# Patient Record
Sex: Female | Born: 1985 | Race: Black or African American | Hispanic: No | Marital: Single | Smoking: Current some day smoker
Health system: Southern US, Community
[De-identification: ages and names within clinical notes are randomized; demographics above are authoritative.]

## PROBLEM LIST (undated history)

## (undated) DIAGNOSIS — K259 Gastric ulcer, unspecified as acute or chronic, without hemorrhage or perforation: Secondary | ICD-10-CM

## (undated) DIAGNOSIS — K589 Irritable bowel syndrome without diarrhea: Secondary | ICD-10-CM

## (undated) DIAGNOSIS — J45909 Unspecified asthma, uncomplicated: Secondary | ICD-10-CM

## (undated) HISTORY — PX: ABDOMINAL HYSTERECTOMY: SHX81

---

## 2017-03-22 ENCOUNTER — Ambulatory Visit (HOSPITAL_COMMUNITY)
Admission: EM | Admit: 2017-03-22 | Discharge: 2017-03-22 | Disposition: A | Discharge status: Home / Self Care | Payer: PRIVATE HEALTH INSURANCE | Attending: Internal Medicine | Admitting: Internal Medicine

## 2017-03-22 ENCOUNTER — Encounter (HOSPITAL_COMMUNITY): Payer: Self-pay | Admitting: Emergency Medicine

## 2017-03-22 DIAGNOSIS — J45909 Unspecified asthma, uncomplicated: Secondary | ICD-10-CM | POA: Insufficient documentation

## 2017-03-22 DIAGNOSIS — Z880 Allergy status to penicillin: Secondary | ICD-10-CM | POA: Insufficient documentation

## 2017-03-22 DIAGNOSIS — J029 Acute pharyngitis, unspecified: Secondary | ICD-10-CM | POA: Diagnosis present

## 2017-03-22 DIAGNOSIS — J039 Acute tonsillitis, unspecified: Secondary | ICD-10-CM | POA: Insufficient documentation

## 2017-03-22 DIAGNOSIS — K589 Irritable bowel syndrome without diarrhea: Secondary | ICD-10-CM | POA: Diagnosis not present

## 2017-03-22 DIAGNOSIS — E860 Dehydration: Secondary | ICD-10-CM | POA: Insufficient documentation

## 2017-03-22 DIAGNOSIS — Z886 Allergy status to analgesic agent status: Secondary | ICD-10-CM | POA: Diagnosis not present

## 2017-03-22 DIAGNOSIS — F17210 Nicotine dependence, cigarettes, uncomplicated: Secondary | ICD-10-CM | POA: Diagnosis not present

## 2017-03-22 HISTORY — DX: Gastric ulcer, unspecified as acute or chronic, without hemorrhage or perforation: K25.9

## 2017-03-22 HISTORY — DX: Unspecified asthma, uncomplicated: J45.909

## 2017-03-22 HISTORY — DX: Irritable bowel syndrome, unspecified: K58.9

## 2017-03-22 LAB — POCT RAPID STREP A: STREPTOCOCCUS, GROUP A SCREEN (DIRECT): NEGATIVE

## 2017-03-22 MED ORDER — KETOROLAC TROMETHAMINE 30 MG/ML IJ SOLN
INTRAMUSCULAR | Status: AC
  Filled 2017-03-22: qty 1

## 2017-03-22 MED ORDER — PREDNISONE 50 MG PO TABS: 50.0000 mg | ORAL_TABLET | Freq: Every day | ORAL | 0 refills | Status: DC

## 2017-03-22 MED ORDER — KETOROLAC TROMETHAMINE 30 MG/ML IJ SOLN: 30.0000 mg | Freq: Once | INTRAMUSCULAR | Status: DC

## 2017-03-22 MED ORDER — IBUPROFEN 800 MG PO TABS: 800.0000 mg | ORAL_TABLET | Freq: Three times a day (TID) | ORAL | 0 refills | Status: DC

## 2017-03-22 MED ORDER — SODIUM CHLORIDE 0.9 % IV BOLUS (SEPSIS)
1000.0000 mL | Freq: Once | INTRAVENOUS | Status: AC
  Administered 2017-03-22: 1000 mL via INTRAVENOUS

## 2017-03-22 MED ORDER — KETOROLAC TROMETHAMINE 30 MG/ML IJ SOLN
30.0000 mg | Freq: Once | INTRAMUSCULAR | Status: AC
  Administered 2017-03-22: 30 mg via INTRAVENOUS

## 2017-03-22 NOTE — Discharge Instructions (Addendum)
Strep swab was negative.  A throat culture is pending.  IV fluids were given at the urgent care today for mild dehydration.  Prescriptions for ibuprofen (for discomfort) and prednisone (for throat pain) were sent to the [facility] on [address].  Push fluids and rest.  Recheck for persistent (>3-4 more days) fever >100.5 or if not starting to improve in a few days.

## 2017-03-22 NOTE — ED Notes (Signed)
Notified dr Dayton Scrape that fluids completed

## 2017-03-22 NOTE — ED Triage Notes (Signed)
PT reports her sore throat started yesterday. PT reports this AM she felt like she could taste blood in the back of her throat. PT reports now her throat pain is severe. PT reports body aches as well.

## 2017-03-23 NOTE — ED Provider Notes (Signed)
[facility]    CSN: [id number] Arrival date & time: 03/22/17  1757     History   Chief Complaint Chief Complaint  Patient presents with  . Sore Throat    HPI Sierra Ortiz is a 31 y.o. female. Presents with bad sore throat that started this am.  Achiness.  Some chills.  Not much runny/congested nose, not coughing.  No vomiting, no diarrhea.    HPI  Past Medical History:  Diagnosis Date  . Asthma   . Gastric ulcer   . IBS (irritable bowel syndrome)     Past Surgical History:  Procedure Laterality Date  . ABDOMINAL HYSTERECTOMY     partial      Home Medications    Prior to Admission medications   Medication Sig Start Date End Date Taking? Authorizing Provider  ibuprofen (ADVIL,MOTRIN) 800 MG tablet Take 1 tablet (800 mg total) by mouth 3 (three) times daily. 03/22/17   Eustace Moore, MD  predniSONE (DELTASONE) 50 MG tablet Take 1 tablet (50 mg total) by mouth daily. 03/22/17   Eustace Moore, MD    Family History No family history on file.  Social History Social History  Substance Use Topics  . Smoking status: Current Every Day Smoker    Packs/day: 0.20    Types: Cigarettes  . Smokeless tobacco: Never Used  . Alcohol use Yes     Comment: rarely     Allergies   Aspirin; Penicillins; and Vicodin [hydrocodone-acetaminophen]   Review of Systems Review of Systems  All other systems reviewed and are negative.    Physical Exam Triage Vital Signs ED Triage Vitals  Enc Vitals Group     BP 03/22/17 1834 (!) 92/51     Pulse Rate 03/22/17 1834 100     Resp 03/22/17 1834 16     Temp 03/22/17 1834 98.2 F (36.8 C)     Temp Source 03/22/17 1834 Oral     SpO2 03/22/17 1834 98 %     Weight 03/22/17 1829 105 lb (47.6 kg)     Height 03/22/17 1829  (1.549 m)     Pain Score 03/22/17 1829 8     Pain Loc --    Updated Vital Signs BP 104/67   Pulse 91   Temp 98.2 F (36.8 C) (Oral)   Resp 16   Ht  (1.549 m)   Wt 105 lb (47.6 kg)    SpO2 100%   BMI 19.84 kg/m   Physical Exam  Constitutional: She is oriented to person, place, and time. No distress.  HENT:  Head: Atraumatic.  Large red tonsils bilat, with exudate  Eyes:  Conjugate gaze observed, no eye redness/discharge  Neck: Neck supple.  Cardiovascular: Normal rate and regular rhythm.   Pulmonary/Chest: No respiratory distress. She has no wheezes. She has no rales.  Lungs clear, symmetric breath sounds   Abdominal: She exhibits no distension.  Musculoskeletal: Normal range of motion.  Neurological: She is alert and oriented to person, place, and time.  Skin: Skin is warm and dry.  Nursing note and vitals reviewed.    UC Treatments / Results  Labs Results for orders placed or performed during the hospital encounter of 03/22/17  Culture, group A strep  Result Value Ref Range   Specimen Description THROAT    Special Requests NONE    Culture TOO YOUNG TO READ    Report Status PENDING   POCT rapid strep A [facility])  Result  Value Ref Range   Streptococcus, Group A Screen (Direct) NEGATIVE NEGATIVE    Procedures Procedures (including critical care time)  Medications Ordered in UC Medications  sodium chloride 0.9 % bolus 1,000 mL (1,000 mLs Intravenous Given 03/22/17 1918)  ketorolac (TORADOL) 30 MG/ML injection 30 mg (30 mg Intravenous Given 03/22/17 1919)   Hydrated with iv saline 1L, given  iv ketorolac, with improvement in symptoms.      Final Clinical Impressions(s) / UC Diagnoses   Final diagnoses:  Acute tonsillitis, unspecified etiology  Mild dehydration   Strep swab was negative.  A throat culture is pending.  IV fluids were given at the urgent care today for mild dehydration.  Prescriptions for ibuprofen (for discomfort) and prednisone (for throat pain) [facility] [address].  Push fluids and rest.  Recheck for persistent (>3-4 more days) fever >100.5 or if not starting to improve in a few days.  New  Prescriptions Discharge Medication List as of 03/22/2017  8:40 PM    START taking these medications   Details  ibuprofen (ADVIL,MOTRIN) 800 MG tablet Take 1 tablet (800 mg total) by mouth 3 (three) times daily., Starting Sat 03/22/2017, Normal    predniSONE (DELTASONE) 50 MG tablet Take 1 tablet (50 mg total) by mouth daily., Starting Sat 03/22/2017, Normal         Eustace Moore, MD 03/23/17 [phone]

## 2017-03-25 LAB — CULTURE, GROUP A STREP (THRC)

## 2019-08-05 ENCOUNTER — Encounter (HOSPITAL_COMMUNITY): Payer: Self-pay | Admitting: Emergency Medicine

## 2019-08-05 ENCOUNTER — Other Ambulatory Visit: Payer: Self-pay

## 2019-08-05 ENCOUNTER — Emergency Department (HOSPITAL_COMMUNITY)
Admission: EM | Admit: 2019-08-05 | Discharge: 2019-08-05 | Disposition: A | Discharge status: Home / Self Care | Payer: 59 | Attending: Emergency Medicine | Admitting: Emergency Medicine

## 2019-08-05 DIAGNOSIS — Z79899 Other long term (current) drug therapy: Secondary | ICD-10-CM | POA: Insufficient documentation

## 2019-08-05 DIAGNOSIS — R1032 Left lower quadrant pain: Secondary | ICD-10-CM | POA: Insufficient documentation

## 2019-08-05 DIAGNOSIS — F17210 Nicotine dependence, cigarettes, uncomplicated: Secondary | ICD-10-CM | POA: Diagnosis not present

## 2019-08-05 DIAGNOSIS — J45909 Unspecified asthma, uncomplicated: Secondary | ICD-10-CM | POA: Insufficient documentation

## 2019-08-05 LAB — URINALYSIS, ROUTINE W REFLEX MICROSCOPIC
Bilirubin Urine: NEGATIVE
Glucose, UA: NEGATIVE mg/dL
Ketones, ur: 80 mg/dL — AB
Leukocytes,Ua: NEGATIVE
Nitrite: NEGATIVE
Protein, ur: NEGATIVE mg/dL
Specific Gravity, Urine: 1.023 (ref 1.005–1.030)
pH: 6 (ref 5.0–8.0)

## 2019-08-05 LAB — COMPREHENSIVE METABOLIC PANEL
ALT: 13 U/L (ref 0–44)
AST: 15 U/L (ref 15–41)
Albumin: 4.5 g/dL (ref 3.5–5.0)
Alkaline Phosphatase: 40 U/L (ref 38–126)
Anion gap: 12 (ref 5–15)
BUN: 10 mg/dL (ref 6–20)
CO2: 20 mmol/L — ABNORMAL LOW (ref 22–32)
Calcium: 9.4 mg/dL (ref 8.9–10.3)
Chloride: 104 mmol/L (ref 98–111)
Creatinine, Ser: 0.85 mg/dL (ref 0.44–1.00)
GFR calc Af Amer: >60 mL/min (ref >60)
GFR calc non Af Amer: >60 mL/min (ref >60)
Glucose, Bld: 84 mg/dL (ref 70–99)
Potassium: 3.7 mmol/L (ref 3.5–5.1)
Sodium: 136 mmol/L (ref 135–145)
Total Bilirubin: 0.9 mg/dL (ref 0.3–1.2)
Total Protein: 8.1 g/dL (ref 6.5–8.1)

## 2019-08-05 LAB — CBC
HCT: 37.6 % (ref 36.0–46.0)
Hemoglobin: 12.8 g/dL (ref 12.0–15.0)
MCH: 32.2 pg (ref 26.0–34.0)
MCHC: 34 g/dL (ref 30.0–36.0)
MCV: 94.5 fL (ref 80.0–100.0)
Platelets: 240 10*3/uL (ref 150–400)
RBC: 3.98 MIL/uL (ref 3.87–5.11)
RDW: 12.6 % (ref 11.5–15.5)
WBC: 7.9 10*3/uL (ref 4.0–10.5)
nRBC: 0 % (ref 0.0–0.2)

## 2019-08-05 LAB — LIPASE, BLOOD: Lipase: 17 U/L (ref 11–51)

## 2019-08-05 LAB — WET PREP, GENITAL
Clue Cells Wet Prep HPF POC: NONE SEEN
Sperm: NONE SEEN
Trich, Wet Prep: NONE SEEN
WBC, Wet Prep HPF POC: NONE SEEN
Yeast Wet Prep HPF POC: NONE SEEN

## 2019-08-05 LAB — I-STAT BETA HCG BLOOD, ED (MC, WL, AP ONLY): I-stat hCG, quantitative: <5 m[IU]/mL (ref <5)

## 2019-08-05 MED ORDER — IBUPROFEN 600 MG PO TABS: 600.0000 mg | ORAL_TABLET | Freq: Four times a day (QID) | ORAL | 0 refills | Status: DC | PRN

## 2019-08-05 MED ORDER — SODIUM CHLORIDE 0.9 % IV BOLUS
1000.0000 mL | Freq: Once | INTRAVENOUS | Status: AC
  Administered 2019-08-05: 1000 mL via INTRAVENOUS

## 2019-08-05 MED ORDER — KETOROLAC TROMETHAMINE 30 MG/ML IJ SOLN
30.0000 mg | Freq: Once | INTRAMUSCULAR | Status: AC
  Administered 2019-08-05: 30 mg via INTRAVENOUS
  Filled 2019-08-05: qty 1

## 2019-08-05 MED ORDER — ONDANSETRON HCL 4 MG/2ML IJ SOLN
4.0000 mg | Freq: Once | INTRAMUSCULAR | Status: AC
  Administered 2019-08-05: 4 mg via INTRAVENOUS
  Filled 2019-08-05: qty 2

## 2019-08-05 MED ORDER — SODIUM CHLORIDE 0.9% FLUSH
3.0000 mL | Freq: Once | INTRAVENOUS | Status: AC
  Administered 2019-08-05: 3 mL via INTRAVENOUS

## 2019-08-05 MED ORDER — ONDANSETRON 4 MG PO TBDP: 4.0000 mg | ORAL_TABLET | Freq: Three times a day (TID) | ORAL | 0 refills | Status: DC | PRN

## 2019-08-05 NOTE — ED Notes (Addendum)
Pt stated she has been nauseated for over a month, sharp left abdominal pain started today. Stated hx of uti, ovarian cysts, and partial hysterectomy 2004. Family at bedside.

## 2019-08-05 NOTE — ED Notes (Signed)
Patient is resting comfortably. Daughter at bedside

## 2019-08-05 NOTE — ED Notes (Signed)
ED Provider at bedside. 

## 2019-08-05 NOTE — ED Notes (Signed)
Requested supplies at bedside 

## 2019-08-05 NOTE — ED Provider Notes (Signed)
[place] DEPT Provider Note   CSN: [id number] Arrival date & time: 08/05/19  1906     History   Chief Complaint Chief Complaint  Patient presents with  . Abdominal Pain  . Back Pain    HPI Sierra Ortiz is a 33 y.o. female.     Pt presents to the ED today with LLQ abdominal pain.  Pt said she's had the pain for about 1 month.  She thought she may have a UTI, so her pcp called her in [place].  She said that helped a little, but the pain came back.  She also has a hx of ovarian cysts and this feels similar.  The pt has a hx of a partial hyst, but still has her ovaries.  No fevers, but has had chills.     Past Medical History:  Diagnosis Date  . Asthma   . Gastric ulcer   . IBS (irritable bowel syndrome)     There are no active problems to display for this patient.   Past Surgical History:  Procedure Laterality Date  . ABDOMINAL HYSTERECTOMY     partial      OB History   No obstetric history on file.      Home Medications    Prior to Admission medications   Medication Sig Start Date End Date Taking? Authorizing Provider  acetaminophen (TYLENOL) 500 MG tablet Take 1,000 mg by mouth every 6 (six) hours as needed for mild pain.   Yes [provider]  MELATONIN PO Take 1 tablet by mouth at bedtime as needed (sleep).   Yes [provider]  ibuprofen (ADVIL) 600 MG tablet Take 1 tablet (600 mg total) by mouth every 6 (six) hours as needed. 08/05/19   Isla Pence, MD  ondansetron (ZOFRAN ODT) 4 MG disintegrating tablet Take 1 tablet (4 mg total) by mouth every 8 (eight) hours as needed. 08/05/19   Isla Pence, MD  predniSONE (DELTASONE) 50 MG tablet Take 1 tablet (50 mg total) by mouth daily. Patient not taking: Reported on 08/05/2019 03/22/17   Wynona Luna, MD    Family History History reviewed. No pertinent family history.  Social History Social History   Tobacco Use  . Smoking status: Current  Every Day Smoker    Packs/day: 0.20    Types: Cigarettes  . Smokeless tobacco: Never Used  Substance Use Topics  . Alcohol use: Yes    Comment: rarely  . Drug use: No     Allergies   Aspirin, Hydrocodone-acetaminophen, Penicillin v, Penicillins, and Vicodin [hydrocodone-acetaminophen]   Review of Systems Review of Systems  Gastrointestinal: Positive for abdominal pain, nausea and vomiting.  All other systems reviewed and are negative.    Physical Exam Updated Vital Signs BP 114/82   Pulse (!) 102   Temp 98.2 F (36.8 C) (Oral)   Resp 17   Ht 5\' 2"  (1.575 m)   Wt 47.6 kg   SpO2 100%   BMI 19.20 kg/m   Physical Exam Vitals signs and nursing note reviewed. Exam conducted with a chaperone present.  Constitutional:      Appearance: She is well-developed.  HENT:     Head: Normocephalic and atraumatic.     Mouth/Throat:     Mouth: Mucous membranes are moist.     Pharynx: Oropharynx is clear.  Eyes:     Extraocular Movements: Extraocular movements intact.     Pupils: Pupils are equal, round, and reactive to light.  Cardiovascular:  Rate and Rhythm: Regular rhythm. Tachycardia present.  Pulmonary:     Effort: Pulmonary effort is normal.     Breath sounds: Normal breath sounds.  Abdominal:     General: Abdomen is flat.     Tenderness: There is abdominal tenderness in the left lower quadrant.  Genitourinary:    Vagina: Normal.     Cervix: Normal.     Uterus: Normal.      Adnexa: Right adnexa normal and left adnexa normal.  Skin:    General: Skin is warm.     Capillary Refill: Capillary refill takes less than 2 seconds.  Neurological:     General: No focal deficit present.     Mental Status: She is alert and oriented to person, place, and time.  Psychiatric:        Mood and Affect: Mood normal.        Behavior: Behavior normal.      ED Treatments / Results  Labs (all labs ordered are listed, but only abnormal results are displayed) Labs Reviewed   COMPREHENSIVE METABOLIC PANEL - Abnormal; Notable for the following components:      Result Value   CO2 20 (*)    All other components within normal limits  URINALYSIS, ROUTINE W REFLEX MICROSCOPIC - Abnormal; Notable for the following components:   Hgb urine dipstick SMALL (*)    Ketones, ur 80 (*)    Bacteria, UA RARE (*)    All other components within normal limits  WET PREP, GENITAL  LIPASE, BLOOD  CBC  I-STAT BETA HCG BLOOD, ED ([facility], [facility], [facility] ONLY)  GC/CHLAMYDIA PROBE AMP ([place] NOT [facility]    EKG None  Radiology No results found.  Procedures Procedures (including critical care time)  Medications Ordered in ED Medications  sodium chloride flush (NS) 0.9 % injection 3 mL (3 mLs Intravenous Given by Other 08/05/19 1952)  sodium chloride 0.9 % bolus 1,000 mL (0 mLs Intravenous Stopped 08/05/19 2138)  ondansetron (ZOFRAN) injection 4 mg (4 mg Intravenous Given 08/05/19 2017)  ketorolac (TORADOL) 30 MG/ML injection 30 mg (30 mg Intravenous Given 08/05/19 2017)     Initial Impression / Assessment and Plan / ED Course  I have reviewed the triage vital signs and the nursing notes.  Pertinent labs & imaging results that were available during my care of the patient were reviewed by me and considered in my medical decision making (see chart for details).       Pt said this feels like an ovarian cyst.  Labs wnl.  Pain is much better.  Pt is stable for d/c.  She knows to return if worse.  F[facility] with [facility].  Final Clinical Impressions(s) / ED Diagnoses   Final diagnoses:  Left lower quadrant abdominal pain    ED Discharge Orders         Ordered    ibuprofen (ADVIL) 600 MG tablet  Every 6 hours PRN     08/05/19 2213    ondansetron (ZOFRAN ODT) 4 MG disintegrating tablet  Every 8 hours PRN     08/05/19 2213           Jacalyn LefevreHaviland, Derrill Bagnell, MD 08/05/19 2216

## 2019-08-05 NOTE — ED Triage Notes (Signed)
Patient complianing of left lower abdominal pain that has been going on for a month. Patient states that she went to her doctor and he treated her for a uti. Patient states that the pain went away but is back.

## 2019-08-07 LAB — GC/CHLAMYDIA PROBE AMP (~~LOC~~) NOT AT ARMC
Chlamydia: NEGATIVE
Neisseria Gonorrhea: NEGATIVE

## 2020-09-07 ENCOUNTER — Emergency Department (HOSPITAL_COMMUNITY): Payer: 59

## 2020-09-07 ENCOUNTER — Emergency Department (HOSPITAL_COMMUNITY)
Admission: EM | Admit: 2020-09-07 | Discharge: 2020-09-07 | Disposition: A | Discharge status: Home / Self Care | Payer: 59 | Attending: Emergency Medicine | Admitting: Emergency Medicine

## 2020-09-07 ENCOUNTER — Encounter (HOSPITAL_COMMUNITY): Payer: Self-pay

## 2020-09-07 DIAGNOSIS — R102 Pelvic and perineal pain: Secondary | ICD-10-CM | POA: Diagnosis not present

## 2020-09-07 DIAGNOSIS — R1032 Left lower quadrant pain: Secondary | ICD-10-CM | POA: Insufficient documentation

## 2020-09-07 DIAGNOSIS — N3001 Acute cystitis with hematuria: Secondary | ICD-10-CM

## 2020-09-07 DIAGNOSIS — J45909 Unspecified asthma, uncomplicated: Secondary | ICD-10-CM | POA: Insufficient documentation

## 2020-09-07 DIAGNOSIS — R112 Nausea with vomiting, unspecified: Secondary | ICD-10-CM | POA: Insufficient documentation

## 2020-09-07 DIAGNOSIS — F17210 Nicotine dependence, cigarettes, uncomplicated: Secondary | ICD-10-CM | POA: Diagnosis not present

## 2020-09-07 DIAGNOSIS — N200 Calculus of kidney: Secondary | ICD-10-CM

## 2020-09-07 LAB — CBC
HCT: 35.9 % — ABNORMAL LOW (ref 36.0–46.0)
Hemoglobin: 12.7 g/dL (ref 12.0–15.0)
MCH: 32.6 pg (ref 26.0–34.0)
MCHC: 35.4 g/dL (ref 30.0–36.0)
MCV: 92.3 fL (ref 80.0–100.0)
Platelets: 216 10*3/uL (ref 150–400)
RBC: 3.89 MIL/uL (ref 3.87–5.11)
RDW: 13.2 % (ref 11.5–15.5)
WBC: 5.7 10*3/uL (ref 4.0–10.5)
nRBC: 0 % (ref 0.0–0.2)

## 2020-09-07 LAB — URINALYSIS, ROUTINE W REFLEX MICROSCOPIC
Bilirubin Urine: NEGATIVE
Glucose, UA: NEGATIVE mg/dL
Ketones, ur: 80 mg/dL — AB
Leukocytes,Ua: NEGATIVE
Nitrite: POSITIVE — AB
Protein, ur: 100 mg/dL — AB
RBC / HPF: >50 RBC/hpf — ABNORMAL HIGH (ref 0–5)
Specific Gravity, Urine: 1.026 (ref 1.005–1.030)
pH: 9 — ABNORMAL HIGH (ref 5.0–8.0)

## 2020-09-07 LAB — COMPREHENSIVE METABOLIC PANEL
ALT: 12 U/L (ref 0–44)
AST: 15 U/L (ref 15–41)
Albumin: 4.5 g/dL (ref 3.5–5.0)
Alkaline Phosphatase: 35 U/L — ABNORMAL LOW (ref 38–126)
Anion gap: 10 (ref 5–15)
BUN: 10 mg/dL (ref 6–20)
CO2: 23 mmol/L (ref 22–32)
Calcium: 9.2 mg/dL (ref 8.9–10.3)
Chloride: 106 mmol/L (ref 98–111)
Creatinine, Ser: 0.83 mg/dL (ref 0.44–1.00)
GFR calc Af Amer: >60 mL/min (ref >60)
GFR calc non Af Amer: >60 mL/min (ref >60)
Glucose, Bld: 101 mg/dL — ABNORMAL HIGH (ref 70–99)
Potassium: 3.6 mmol/L (ref 3.5–5.1)
Sodium: 139 mmol/L (ref 135–145)
Total Bilirubin: 0.8 mg/dL (ref 0.3–1.2)
Total Protein: 7.8 g/dL (ref 6.5–8.1)

## 2020-09-07 LAB — LIPASE, BLOOD: Lipase: 20 U/L (ref 11–51)

## 2020-09-07 LAB — I-STAT BETA HCG BLOOD, ED (MC, WL, AP ONLY): I-stat hCG, quantitative: <5 m[IU]/mL (ref <5)

## 2020-09-07 MED ORDER — HYDROCODONE-ACETAMINOPHEN 5-325 MG PO TABS: 1.0000 | ORAL_TABLET | ORAL | 0 refills | Status: DC | PRN

## 2020-09-07 MED ORDER — HYDROMORPHONE HCL 1 MG/ML IJ SOLN
0.5000 mg | Freq: Once | INTRAMUSCULAR | Status: AC
  Administered 2020-09-07: 0.5 mg via INTRAVENOUS
  Filled 2020-09-07: qty 1

## 2020-09-07 MED ORDER — SULFAMETHOXAZOLE-TRIMETHOPRIM 800-160 MG PO TABS: 1.0000 | ORAL_TABLET | Freq: Two times a day (BID) | ORAL | 0 refills | Status: AC

## 2020-09-07 MED ORDER — KETOROLAC TROMETHAMINE 30 MG/ML IJ SOLN
30.0000 mg | Freq: Once | INTRAMUSCULAR | Status: AC
  Administered 2020-09-07: 30 mg via INTRAVENOUS
  Filled 2020-09-07: qty 1

## 2020-09-07 MED ORDER — ONDANSETRON 4 MG PO TBDP: 4.0000 mg | ORAL_TABLET | Freq: Three times a day (TID) | ORAL | 0 refills | Status: DC | PRN

## 2020-09-07 MED ORDER — HYDROMORPHONE HCL 1 MG/ML IJ SOLN
1.0000 mg | Freq: Once | INTRAMUSCULAR | Status: AC
  Administered 2020-09-07: 1 mg via INTRAVENOUS
  Filled 2020-09-07: qty 1

## 2020-09-07 MED ORDER — SODIUM CHLORIDE 0.9 % IV SOLN
1.0000 g | Freq: Once | INTRAVENOUS | Status: AC
  Administered 2020-09-07: 1 g via INTRAVENOUS
  Filled 2020-09-07: qty 10

## 2020-09-07 MED ORDER — ONDANSETRON HCL 4 MG/2ML IJ SOLN
4.0000 mg | Freq: Once | INTRAMUSCULAR | Status: AC
  Administered 2020-09-07: 4 mg via INTRAVENOUS
  Filled 2020-09-07: qty 2

## 2020-09-07 NOTE — ED Provider Notes (Signed)
[facility] Provider Note   CSN: [id number] Arrival date & time: 09/07/20  1123     History Chief Complaint  Patient presents with  . Abdominal Pain    Sierra Ortiz is a 34 y.o. female w/ past medical history of gastric ulcer, IBS, ovarian cyst, partial hysterectomy that presents emergency department today for left lower quadrant abdominal pain.  Patient states that pain started around 10 AM, about 2 and half hours ago.  States that she has tried Tylenol, was in excruciating pain, sudden onset therefore called EMS.  States that she did have one episode of nonbilious nonbloody vomiting.  Is currently nauseous.  No diarrhea.  States that abdominal pain radiates down into her pelvis.  No history of kidney stones.  States that she did have previous ovarian cyst rupture, does feel slightly similar, however is worse this time.  Denies any vaginal bleeding, vaginal discharge.  Denies any chance of STDs.  No hematuria or dysuria, patient states that she was in normal health yesterday.  Was given 100 mics of fentanyl via EMS.  No recent heavy lifting or recent intercourse.  HPI     Past Medical History:  Diagnosis Date  . Asthma   . Gastric ulcer   . IBS (irritable bowel syndrome)     There are no problems to display for this patient.   Past Surgical History:  Procedure Laterality Date  . ABDOMINAL HYSTERECTOMY     partial      OB History   No obstetric history on file.     History reviewed. No pertinent family history.  Social History   Tobacco Use  . Smoking status: Current Every Day Smoker    Packs/day: 0.20    Types: Cigarettes  . Smokeless tobacco: Never Used  Substance Use Topics  . Alcohol use: Yes    Comment: rarely  . Drug use: No    Home Medications Prior to Admission medications   Medication Sig Start Date End Date Taking? Authorizing Provider  acetaminophen (TYLENOL) 500 MG tablet Take 1,000 mg by mouth every 6 (six)  hours as needed for mild pain.   Yes [provider]  QUEtiapine (SEROQUEL) 25 MG tablet Take 25 mg by mouth 3 (three) times daily as needed (anxiety).   Yes [provider]  ibuprofen (ADVIL) 600 MG tablet Take 1 tablet (600 mg total) by mouth every 6 (six) hours as needed. Patient not taking: Reported on 09/07/2020 08/05/19   Jacalyn Lefevre, MD  ondansetron (ZOFRAN ODT) 4 MG disintegrating tablet Take 1 tablet (4 mg total) by mouth every 8 (eight) hours as needed. Patient not taking: Reported on 09/07/2020 08/05/19   Jacalyn Lefevre, MD  predniSONE (DELTASONE) 50 MG tablet Take 1 tablet (50 mg total) by mouth daily. Patient not taking: Reported on 08/05/2019 03/22/17   Isa Rankin, MD  QUEtiapine (SEROQUEL) 100 MG tablet Take 100 mg by mouth at bedtime. 08/30/20   [provider]    Allergies    Aspirin, Hydrocodone-acetaminophen, Penicillin v, and Vicodin [hydrocodone-acetaminophen]  Review of Systems   Review of Systems  Constitutional: Negative for chills, diaphoresis, fatigue and fever.  HENT: Negative for congestion, sore throat and trouble swallowing.   Eyes: Negative for pain and visual disturbance.  Respiratory: Negative for cough, shortness of breath and wheezing.   Cardiovascular: Negative for chest pain, palpitations and leg swelling.  Gastrointestinal: Positive for abdominal pain, nausea and vomiting. Negative for abdominal distention and diarrhea.  Genitourinary:  Positive for flank pain and pelvic pain. Negative for decreased urine volume, difficulty urinating, dysuria, enuresis, frequency, genital sores, menstrual problem, urgency, vaginal bleeding, vaginal discharge and vaginal pain.  Musculoskeletal: Negative for back pain, neck pain and neck stiffness.  Skin: Negative for pallor.  Neurological: Negative for dizziness, speech difficulty, weakness and headaches.  Psychiatric/Behavioral: Negative for confusion.    Physical Exam Updated Vital  Signs BP 124/83   Pulse 85   Temp 98 F (36.7 C) (Oral)   Resp 18   SpO2 99%   Physical Exam Constitutional:      General: She is in acute distress.     Appearance: Normal appearance. She is ill-appearing. She is not toxic-appearing or diaphoretic.     Comments: Patient appears extremely uncomfortable, laying on the floor to find a comfortable position.  Holding abdomen and crying.   Cardiovascular:     Rate and Rhythm: Normal rate and regular rhythm.     Pulses: Normal pulses.  Pulmonary:     Effort: Pulmonary effort is normal.     Breath sounds: Normal breath sounds.  Abdominal:     General: Abdomen is flat. Bowel sounds are normal. There is no distension. There are no signs of injury.     Palpations: Abdomen is soft. There is no shifting dullness.     Tenderness: There is abdominal tenderness in the left lower quadrant. There is guarding. There is no left CVA tenderness or rebound. Negative signs include Murphy's sign, Rovsing's sign, McBurney's sign, psoas sign and obturator sign.     Comments: Tenderness from left flank up into the left lower quadrant that extends down into the left pelvis area.  Patient is guarding.  No peritoneal signs.  No ecchymosis.  Abdomen is soft.  Musculoskeletal:        General: Normal range of motion.     Comments: No tenderness tocervical, thoracic or lumbar midline spine  Skin:    General: Skin is warm and dry.     Capillary Refill: Capillary refill takes less than 2 seconds.  Neurological:     General: No focal deficit present.     Mental Status: She is alert and oriented to person, place, and time.  Psychiatric:        Mood and Affect: Mood normal.        Behavior: Behavior normal.        Thought Content: Thought content normal.     ED Results / Procedures / Treatments   Labs (all labs ordered are listed, but only abnormal results are displayed) Labs Reviewed  COMPREHENSIVE METABOLIC PANEL - Abnormal; Notable for the following  components:      Result Value   Glucose, Bld 101 (*)    Alkaline Phosphatase 35 (*)    All other components within normal limits  CBC - Abnormal; Notable for the following components:   HCT 35.9 (*)    All other components within normal limits  URINALYSIS, ROUTINE W REFLEX MICROSCOPIC - Abnormal; Notable for the following components:   APPearance HAZY (*)    pH 9.0 (*)    Hgb urine dipstick MODERATE (*)    Ketones, ur 80 (*)    Protein, ur 100 (*)    Nitrite POSITIVE (*)    RBC / HPF >50 (*)    Bacteria, UA MANY (*)    All other components within normal limits  URINE CULTURE  LIPASE, BLOOD  I-STAT BETA HCG BLOOD, ED ([facility], [facility], [facility] ONLY)  EKG None  Radiology US PELVIC COMPLETE W TRANSVAGINAL AND TORSION R/O  Result Date: 09/07/2020 CLINICAL DATA:  Left lower quadrant pain since this morning. Prior hysterectomy. EXAM: TRANSABDOMINAL AND TRANSVAGINAL ULTRASOUND OF PELVIS DOPPLER ULTRASOUND OF OVARIES TECHNIQUE: Both transabdominal and transvaginal ultrasound examinations of the pelvis were performed. Transabdominal technique was performed for global imaging of the pelvis including uterus, ovaries, adnexal regions, and pelvic cul-de-sac. It was necessary to proceed with endovaginal exam following the transabdominal exam to visualize the adnexae in better detail. Color and duplex Doppler ultrasound was utilized to evaluate blood flow to the ovaries. COMPARISON:  None. FINDINGS: Uterus Previous hysterectomy Endometrium Previous hysterectomy Right ovary Measurements: 3.1 x 1.9 x 1.7 cm = volume: 5.3 mL. Normal appearance/no adnexal mass. Left ovary Measurements: 4.0 x 2.0 x 2.4 cm = volume: 9.7 mL. Normal appearance/no adnexal mass. Pulsed Doppler evaluation of both ovaries demonstrates normal low-resistance arterial and venous waveforms. Other findings No abnormal free fluid. IMPRESSION: Normal appearing ovaries and Doppler evaluation. Remote hysterectomy No pelvic free fluid Electronically  Signed   By: Judie Petit.  Shick M.D.   On: 09/07/2020 13:37    Procedures Procedures (including critical care time)  Medications Ordered in ED Medications  HYDROmorphone (DILAUDID) injection 1 mg (1 mg Intravenous Given 09/07/20 1318)  ondansetron (ZOFRAN) injection 4 mg (4 mg Intravenous Given 09/07/20 1318)    ED Course  I have reviewed the triage vital signs and the nursing notes.  Pertinent labs & imaging results that were available during my care of the patient were reviewed by me and considered in my medical decision making (see chart for details).    MDM Rules/Calculators/A&P                          Pantera Winterrowd is a 34 y.o. female w/ past medical history of gastric ulcer, IBS, ovarian cyst, partial hysterectomy that presents emergency department today for left lower quadrant abdominal pain.  Differential to include ovarian torsion, ovarian cyst rupture, kidney stone, Pyelo, TOA.  After evaluating patient did speak to ultrasound tech to make sure patient has next to get evaluated for possible ovarian torsion.  On physical patient is extremely uncomfortable, patient has been given fentanyl and Dilaudid at this time.  Patient states that she does not want pelvic exam yet.  Pelvic ultrasound negative for ovarian torsion or ovarian cyst rupture.  Patient is still constantly in pain, will obtain CT renal stone study at this time.  Spoke to patient about pelvic exam again, patient states that she wants this after CT renal study.  CMP and CBC unremarkable, no leukocytosis or leukopenia.  Urinalysis does suggest UTI, if patient has infected stone most likely will need to have this removed.  Pt care was handed off to B. Henderely PA-C at hand off.  Complete history and physical and current plan have been communicated.  Please refer to their note for the remainder of ED care and ultimate disposition.  Awaiting CT renal study, pelvic exam.  This chart was discussed with Dr. Rodena Medin who agreed with  the care and disposition of the patient.    Final Clinical Impression(s) / ED Diagnoses Final diagnoses:  LLQ pain    Rx / DC Orders ED Discharge Orders    None       Farrel Gordon, PA-C 09/07/20 1519    Wynetta Fines, MD 09/10/20 1702

## 2020-09-07 NOTE — ED Triage Notes (Addendum)
Pt presents with c/o LLQ abdominal pain, hx of ruptured ovarian cyst. Pt has an 18 g left AC, 100 mcg fentanyl given and 4 of zofran en route.

## 2020-09-07 NOTE — ED Notes (Signed)
Pt on her knees at bedside in pain. Pt claims she "cannot get comfortable so I am getting on then floor on my knees."

## 2020-09-07 NOTE — Discharge Instructions (Addendum)
You had a 2 mm kidney stone on your left side which is likely causing your pain. Your urinalysis did show infection. You were given antibiotics here in the emergency department and I will write a prescription for outpatient antibiotics. I have also given you additional medication for pain. Take as needed. I would also recommend additional ibuprofen, do not take more than 2400 mg daily.  If you have increasing pain not controlled with your pain medication, high fever, persistent vomiting seek reevaluation the emergency department  Follow-up with urology  Return for any worsening symptoms.

## 2020-09-07 NOTE — ED Provider Notes (Signed)
Care transferred from [place], [place] at shift change. See note for full HPI  In summation 34 year old presents for LLQ and left flank pain. Began today. No hs of kidney stones. Declined GU exam from previous provider.  UA positive for UTI, culture sent CBC without leukocytosis CMP without electrolyte renal or liver abnormality Lipase 20 Preg negative [place] negative for pelvic pathology, torsion  Plan on FU on CT stone study, pain management.   Physical Exam  BP (!) 118/92   Pulse 96   Temp 98 F (36.7 C) (Oral)   Resp 18   SpO2 99%   Physical Exam Vitals and nursing note reviewed.  Constitutional:      General: She is not in acute distress.    Appearance: She is well-developed. She is not ill-appearing, toxic-appearing or diaphoretic.  HENT:     Head: Normocephalic and atraumatic.     Mouth/Throat:     Mouth: Mucous membranes are moist.  Eyes:     Pupils: Pupils are equal, round, and reactive to light.  Cardiovascular:     Rate and Rhythm: Normal rate.     Heart sounds: Normal heart sounds.  Pulmonary:     Effort: Pulmonary effort is normal. No respiratory distress.     Breath sounds: Normal breath sounds.  Abdominal:     General: Bowel sounds are normal. There is no distension.     Tenderness: There is abdominal tenderness in the left lower quadrant. There is left CVA tenderness. There is no right CVA tenderness, guarding or rebound. Negative signs include Murphy's sign and McBurney's sign.     Hernia: No hernia is present.  Genitourinary:    Comments: Declined GU exam Musculoskeletal:        General: Normal range of motion.     Cervical back: Normal range of motion.  Skin:    General: Skin is warm and dry.     Capillary Refill: Capillary refill takes less than 2 seconds.  Neurological:     General: No focal deficit present.     Mental Status: She is alert and oriented to person, place, and time.    ED Course/Procedures     Procedures Labs Reviewed   COMPREHENSIVE METABOLIC PANEL - Abnormal; Notable for the following components:      Result Value   Glucose, Bld 101 (*)    Alkaline Phosphatase 35 (*)    All other components within normal limits  CBC - Abnormal; Notable for the following components:   HCT 35.9 (*)    All other components within normal limits  URINALYSIS, ROUTINE W REFLEX MICROSCOPIC - Abnormal; Notable for the following components:   APPearance HAZY (*)    pH 9.0 (*)    Hgb urine dipstick MODERATE (*)    Ketones, ur 80 (*)    Protein, ur 100 (*)    Nitrite POSITIVE (*)    RBC / HPF >50 (*)    Bacteria, UA MANY (*)    All other components within normal limits  URINE CULTURE  LIPASE, BLOOD  I-STAT BETA HCG BLOOD, ED ([facility], [facility], [facility] ONLY)  CT Renal Stone Study  Result Date: 09/07/2020 CLINICAL DATA:  Left flank pain, kidney stone. History of gastric ulcer, IBS, ovarian cyst, partial hysterectomy. EXAM: CT ABDOMEN AND PELVIS WITHOUT CONTRAST TECHNIQUE: Multidetector CT imaging of the abdomen and pelvis was performed following the standard protocol without IV contrast. COMPARISON:  Ultrasound pelvis 09/07/2020 FINDINGS: Lower chest: No acute abnormality Hepatobiliary: No focal liver  abnormality is seen. No gallstones, gallbladder wall thickening, or biliary dilatation. Pancreas: Unremarkable. No pancreatic ductal dilatation or surrounding inflammatory changes. Spleen: Normal in size without focal abnormality. Adrenals/Urinary Tract: No adrenal nodule bilaterally. There is a 2 mm calcified stone within the inferior pole of the left kidney. No right nephrolithiasis. No hydronephrosis, and no contour-deforming renal mass. Suggestion of a 2 mm calcified left ureterovesicular junction stone. No right ureterolithiasis. No hydroureter. The urinary bladder is unremarkable. Stomach/Bowel: Stomach is within normal limits. The appendix is not definitely identified. No evidence of bowel wall thickening, distention, or inflammatory changes.  Vascular/Lymphatic: No abdominal aorta or iliac aneurysm. No abdominal, pelvic, or inguinal lymphadenopathy. Multiple phleboliths are identified within the pelvis. Reproductive: Status post hysterectomy. No adnexal masses. Bilateral ovaries appear normal in size. Other: No intraperitoneal free fluid. No intraperitoneal free gas. No organized fluid collection. Musculoskeletal: No acute or significant osseous findings. IMPRESSION: 1. Suggestion of a nonobstructive 2 mm left ureterovesicular junction stone. 2. Nonobstructive 2 mm left nephrolithiasis. Electronically Signed   By: Tish Frederickson M.D.   On: 09/07/2020 16:10   US PELVIC COMPLETE W TRANSVAGINAL AND TORSION R/O  Result Date: 09/07/2020 CLINICAL DATA:  Left lower quadrant pain since this morning. Prior hysterectomy. EXAM: TRANSABDOMINAL AND TRANSVAGINAL ULTRASOUND OF PELVIS DOPPLER ULTRASOUND OF OVARIES TECHNIQUE: Both transabdominal and transvaginal ultrasound examinations of the pelvis were performed. Transabdominal technique was performed for global imaging of the pelvis including uterus, ovaries, adnexal regions, and pelvic cul-de-sac. It was necessary to proceed with endovaginal exam following the transabdominal exam to visualize the adnexae in better detail. Color and duplex Doppler ultrasound was utilized to evaluate blood flow to the ovaries. COMPARISON:  None. FINDINGS: Uterus Previous hysterectomy Endometrium Previous hysterectomy Right ovary Measurements: 3.1 x 1.9 x 1.7 cm = volume: 5.3 mL. Normal appearance/no adnexal mass. Left ovary Measurements: 4.0 x 2.0 x 2.4 cm = volume: 9.7 mL. Normal appearance/no adnexal mass. Pulsed Doppler evaluation of both ovaries demonstrates normal low-resistance arterial and venous waveforms. Other findings No abnormal free fluid. IMPRESSION: Normal appearing ovaries and Doppler evaluation. Remote hysterectomy No pelvic free fluid Electronically Signed   By: Judie Petit.  Shick M.D.   On: 09/07/2020 13:37   MDM   Plan to follow up on CT scan and dispo.  Patient reassessed. Pain controlled. Tolerating p.o. intake without difficulty. CT scan with 2 mm UVJ stone. No hydronephrosis. Creatinine WNL. Patient is afebrile, she is without tachycardia, tachypnea or hypoxia. She has no leukocytosis. She does not appear septic or ill. She will be given Rocephin as well as Toradol for pain and UTI, Urine culture sent.  CONSULT with her [place] with urology. States patient can D/C home with antibiotics, strict return precautions and follow up outpatient.  Discussed plan with patient. She is agreeable to this. Will DC home with Abx, Rocephin given here in ED.  The patient has been appropriately medically screened and/or stabilized in the ED. I have low suspicion for any other emergent medical condition which would require further screening, evaluation or treatment in the ED or require inpatient management.  Patient is hemodynamically stable and in no acute distress.  Patient able to ambulate in department prior to ED.  Evaluation does not show acute pathology that would require ongoing or additional emergent interventions while in the emergency department or further inpatient treatment.  I have discussed the diagnosis with the patient and answered all questions.  Pain is been managed while in the emergency department and patient has no further  complaints prior to discharge.  Patient is comfortable with plan discussed in room and is stable for discharge at this time.  I have discussed strict return precautions for returning to the emergency department.  Patient was encouraged to follow-up with PCP/specialist refer to at discharge.       Kiarrah Rausch A, PA-C 09/07/20 1732    Sabino Donovan, MD 09/07/20 [phone]

## 2020-09-10 LAB — URINE CULTURE: Culture: >=100000 — AB

## 2020-09-11 ENCOUNTER — Telehealth: Payer: Self-pay | Admitting: Emergency Medicine

## 2020-09-11 NOTE — Progress Notes (Signed)
ED Antimicrobial Stewardship Positive Culture Follow Up   Sierra Ortiz is an 34 y.o. female who presented to [facility] on 09/07/2020 with a chief complaint of  Chief Complaint  Patient presents with  . Abdominal Pain    Recent Results (from the past 720 hour(s))  Urine culture     Status: Abnormal   Collection Time: 09/07/20  1:23 PM   Specimen: Urine, Clean Catch  Result Value Ref Range Status   Specimen Description   Final    URINE, CLEAN CATCH Performed at [facility], [address], [place], [place] [address]    Special Requests   Final    NONE Performed at [facility], [address], [place], [place] [address]    Culture >=100,000 COLONIES/mL ESCHERICHIA COLI (A)  Final   Report Status 09/10/2020 FINAL  Final   Organism ID, Bacteria ESCHERICHIA COLI (A)  Final      Susceptibility   Escherichia coli - MIC*    AMPICILLIN <=2 SENSITIVE Sensitive     CEFAZOLIN <=4 SENSITIVE Sensitive     CEFTRIAXONE <=0.25 SENSITIVE Sensitive     CIPROFLOXACIN <=0.25 SENSITIVE Sensitive     GENTAMICIN <=1 SENSITIVE Sensitive     IMIPENEM <=0.25 SENSITIVE Sensitive     NITROFURANTOIN <=16 SENSITIVE Sensitive     TRIMETH/SULFA >=320 RESISTANT Resistant     AMPICILLIN/SULBACTAM <=2 SENSITIVE Sensitive     PIP/TAZO <=4 SENSITIVE Sensitive     * >=100,000 COLONIES/mL ESCHERICHIA COLI    [x]  Treated with SMX/TMP, organism resistant to prescribed antimicrobial  Pt has PCN intolerance of dizziness, yeast infection. Tolerated ceftriaxone in ED.  New antibiotic prescription:  -Stop taking SMX/TMP -Prescribe cephalexin 500 mg PO q8h x7 days. No refills.   ED Provider: , PA-C   Trudee Grip, PharmD 09/11/2020, 10:36 AM Clinical Pharmacist [phone]'

## 2020-09-11 NOTE — Telephone Encounter (Signed)
Post ED Visit - Positive Culture Follow-up: Successful Patient Follow-Up  Culture assessed and recommendations reviewed by:  []  , Pharm.D. []  Enzo Bi, Pharm.D., BCPS AQ-ID []  , Pharm.D., BCPS []  Celedonio Miyamoto, Pharm.D., BCPS []  [place], Garvin Fila.D., BCPS, AAHIVP []  , Pharm.D., BCPS, AAHIVP []  Georgina Pillion, PharmD, BCPS []  , PharmD, BCPS []  [place], PharmD, BCPS []  [place], PharmD  PharmD  Positive urine culture  []  Patient discharged without antimicrobial prescription and treatment is now indicated [x]  Organism is resistant to prescribed ED discharge antimicrobial []  Patient with positive blood cultures  Changes discussed with ED provider: Estella Husk [facility] New antibiotic prescription stop taking SMX/TMP start Keflex 500mg  po tid x 7 days  Attempting to contact patient   Lysle Pearl 09/11/2020, 11:23 AM

## 2021-03-28 ENCOUNTER — Emergency Department (HOSPITAL_COMMUNITY)
Admission: EM | Admit: 2021-03-28 | Discharge: 2021-03-28 | Disposition: A | Discharge status: Home / Self Care | Payer: 59 | Attending: Emergency Medicine | Admitting: Emergency Medicine

## 2021-03-28 ENCOUNTER — Encounter (HOSPITAL_COMMUNITY): Payer: Self-pay | Admitting: *Deleted

## 2021-03-28 DIAGNOSIS — K5900 Constipation, unspecified: Secondary | ICD-10-CM | POA: Diagnosis not present

## 2021-03-28 DIAGNOSIS — R110 Nausea: Secondary | ICD-10-CM | POA: Diagnosis not present

## 2021-03-28 DIAGNOSIS — J45909 Unspecified asthma, uncomplicated: Secondary | ICD-10-CM | POA: Diagnosis not present

## 2021-03-28 DIAGNOSIS — R109 Unspecified abdominal pain: Secondary | ICD-10-CM | POA: Diagnosis present

## 2021-03-28 DIAGNOSIS — F17210 Nicotine dependence, cigarettes, uncomplicated: Secondary | ICD-10-CM | POA: Insufficient documentation

## 2021-03-28 LAB — COMPREHENSIVE METABOLIC PANEL
ALT: 13 U/L (ref 0–44)
AST: 18 U/L (ref 15–41)
Albumin: 4.7 g/dL (ref 3.5–5.0)
Alkaline Phosphatase: 37 U/L — ABNORMAL LOW (ref 38–126)
Anion gap: 8 (ref 5–15)
BUN: 7 mg/dL (ref 6–20)
CO2: 26 mmol/L (ref 22–32)
Calcium: 9.4 mg/dL (ref 8.9–10.3)
Chloride: 104 mmol/L (ref 98–111)
Creatinine, Ser: 0.75 mg/dL (ref 0.44–1.00)
GFR, Estimated: >60 mL/min (ref >60)
Glucose, Bld: 87 mg/dL (ref 70–99)
Potassium: 3.8 mmol/L (ref 3.5–5.1)
Sodium: 138 mmol/L (ref 135–145)
Total Bilirubin: 0.8 mg/dL (ref 0.3–1.2)
Total Protein: 8.2 g/dL — ABNORMAL HIGH (ref 6.5–8.1)

## 2021-03-28 LAB — CBC WITH DIFFERENTIAL/PLATELET
Abs Immature Granulocytes: 0.01 10*3/uL (ref 0.00–0.07)
Basophils Absolute: 0.1 10*3/uL (ref 0.0–0.1)
Basophils Relative: 1 %
Eosinophils Absolute: 0 10*3/uL (ref 0.0–0.5)
Eosinophils Relative: 1 %
HCT: 38.6 % (ref 36.0–46.0)
Hemoglobin: 13 g/dL (ref 12.0–15.0)
Immature Granulocytes: 0 %
Lymphocytes Relative: 51 %
Lymphs Abs: 2.8 10*3/uL (ref 0.7–4.0)
MCH: 32.9 pg (ref 26.0–34.0)
MCHC: 33.7 g/dL (ref 30.0–36.0)
MCV: 97.7 fL (ref 80.0–100.0)
Monocytes Absolute: 0.5 10*3/uL (ref 0.1–1.0)
Monocytes Relative: 10 %
Neutro Abs: 2 10*3/uL (ref 1.7–7.7)
Neutrophils Relative %: 37 %
Platelets: 212 10*3/uL (ref 150–400)
RBC: 3.95 MIL/uL (ref 3.87–5.11)
RDW: 12.9 % (ref 11.5–15.5)
WBC: 5.5 10*3/uL (ref 4.0–10.5)
nRBC: 0 % (ref 0.0–0.2)

## 2021-03-28 LAB — POC OCCULT BLOOD, ED: Fecal Occult Bld: NEGATIVE

## 2021-03-28 MED ORDER — METOCLOPRAMIDE HCL 10 MG PO TABS: 10.0000 mg | ORAL_TABLET | Freq: Four times a day (QID) | ORAL | 0 refills | Status: DC | PRN

## 2021-03-28 NOTE — ED Notes (Signed)
Discharge instructions provided to the patient and she verbalizes understanding.

## 2021-03-28 NOTE — ED Provider Notes (Signed)
[facility]-EMERGENCY DEPT Provider Note   CSN: [id number] Arrival date & time: 03/28/21  1422     History Chief Complaint  Patient presents with  . Abdominal Pain    Sierra Ortiz is a 35 y.o. female.  HPI She presents for evaluation of, cramping, decreased ability to eat, and constipation characterized by "small hard balls."  She is using OTC stool softener, Colace, 2-4 times a day without relief.  She saw a provider at a walk-in clinic 3 days ago and was started on omeprazole and Zofran.  She went back today and was evaluated and sent here for evaluation of multiple problems including "small bowel obstruction, Zollinger-Ellison syndrome, peptic ulcer disease, gastric outlet obstruction."  Patient has a history of gastric ulcer treated several years ago successfully without ongoing care needed.  She has intermittent bouts of constipation.  She has nausea but no constipation.  There are no other known modifying factors.    Past Medical History:  Diagnosis Date  . Asthma   . Gastric ulcer   . IBS (irritable bowel syndrome)     There are no problems to display for this patient.   Past Surgical History:  Procedure Laterality Date  . ABDOMINAL HYSTERECTOMY     partial      OB History   No obstetric history on file.     No family history on file.  Social History   Tobacco Use  . Smoking status: Current Every Day Smoker    Packs/day: 0.20    Types: Cigarettes  . Smokeless tobacco: Never Used  Substance Use Topics  . Alcohol use: Yes    Comment: rarely  . Drug use: No    Home Medications Prior to Admission medications   Medication Sig Start Date End Date Taking? Authorizing Provider  metoCLOPramide (REGLAN) 10 MG tablet Take 1 tablet (10 mg total) by mouth every 6 (six) hours as needed for nausea (nausea, and to promote gastrointestinal mobility.). 03/28/21  Yes Mancel Bale, MD  acetaminophen (TYLENOL) 500 MG tablet Take 1,000 mg by mouth  every 6 (six) hours as needed for mild pain.    [provider]  HYDROcodone-acetaminophen (NORCO/VICODIN) 5-325 MG tablet Take 1 tablet by mouth every 4 (four) hours as needed. 09/07/20   Henderly, Britni A, PA-C  ibuprofen (ADVIL) 600 MG tablet Take 1 tablet (600 mg total) by mouth every 6 (six) hours as needed. Patient not taking: Reported on 09/07/2020 08/05/19   Jacalyn Lefevre, MD  ondansetron (ZOFRAN ODT) 4 MG disintegrating tablet Take 1 tablet (4 mg total) by mouth every 8 (eight) hours as needed for nausea or vomiting. 09/07/20   Henderly, Britni A, PA-C  predniSONE (DELTASONE) 50 MG tablet Take 1 tablet (50 mg total) by mouth daily. Patient not taking: Reported on 08/05/2019 03/22/17   Isa Rankin, MD  QUEtiapine (SEROQUEL) 100 MG tablet Take 100 mg by mouth at bedtime. 08/30/20   [provider]  QUEtiapine (SEROQUEL) 25 MG tablet Take 25 mg by mouth 3 (three) times daily as needed (anxiety).    [provider]    Allergies    Aspirin, Hydrocodone-acetaminophen, Penicillin v, and Vicodin [hydrocodone-acetaminophen]  Review of Systems   Review of Systems  All other systems reviewed and are negative.   Physical Exam Updated Vital Signs BP 113/78   Pulse 81   Temp 98.5 F (36.9 C) (Oral)   Resp 16   Ht 5' 1.5" (1.562 m)   Wt 51.7 kg  SpO2 100%   BMI 21.19 kg/m   Physical Exam Vitals and nursing note reviewed.  Constitutional:      General: She is not in acute distress.    Appearance: She is well-developed. She is not ill-appearing.  HENT:     Head: Normocephalic and atraumatic.     Right Ear: External ear normal.     Left Ear: External ear normal.  Eyes:     Conjunctiva/sclera: Conjunctivae normal.     Pupils: Pupils are equal, round, and reactive to light.  Neck:     Trachea: Phonation normal.  Cardiovascular:     Rate and Rhythm: Normal rate and regular rhythm.     Heart sounds: Normal heart sounds.  Pulmonary:     Effort:  Pulmonary effort is normal.     Breath sounds: Normal breath sounds.  Abdominal:     General: There is no distension.     Palpations: Abdomen is soft.     Tenderness: There is abdominal tenderness (Diffuse, mild). There is no guarding.  Musculoskeletal:        General: Normal range of motion.     Cervical back: Normal range of motion and neck supple.  Skin:    General: Skin is warm and dry.  Neurological:     Mental Status: She is alert and oriented to person, place, and time.     Cranial Nerves: No cranial nerve deficit.     Sensory: No sensory deficit.     Motor: No abnormal muscle tone.     Coordination: Coordination normal.  Psychiatric:        Mood and Affect: Mood normal.        Behavior: Behavior normal.        Thought Content: Thought content normal.        Judgment: Judgment normal.     ED Results / Procedures / Treatments   Labs (all labs ordered are listed, but only abnormal results are displayed) Labs Reviewed  COMPREHENSIVE METABOLIC PANEL - Abnormal; Notable for the following components:      Result Value   Total Protein 8.2 (*)    Alkaline Phosphatase 37 (*)    All other components within normal limits  CBC WITH DIFFERENTIAL/PLATELET  POC OCCULT BLOOD, ED    EKG None  Radiology No results found.  Procedures Procedures   Medications Ordered in ED Medications - No data to display  ED Course  I have reviewed the triage vital signs and the nursing notes.  Pertinent labs & imaging results that were available during my care of the patient were reviewed by me and considered in my medical decision making (see chart for details).    MDM Rules/Calculators/A&P                           Patient Vitals for the past 24 hrs:  BP Temp Temp src Pulse Resp SpO2 Height Weight  03/28/21 1600 113/78 -- -- 81 16 100 % -- --  03/28/21 1513 122/80 -- -- 91 16 100 % -- --  03/28/21 1431 119/90 98.5 F (36.9 C) Oral (!) 104 17 100 % 5' 1.5" (1.562 m) 51.7 kg     4:18 PM Reevaluation with update and discussion. After initial assessment and treatment, an updated evaluation reveals no change in complaints.  Discussed findings with patient.  Discussed need for CAT scan and I offered her to have one although informed her that I thought it  would be negative.  I suggested that she treat constipation as the primary problem, which should improve most of her symptoms.  She was somewhat frustrated about did not want to proceed with CT imaging.  She will contact her PCP for further guidance, and referral to GI.Marland Kitchen Mancel Bale   Medical Decision Making:  This patient is presenting for evaluation of ongoing symptoms, including nausea abdominal cramping, and constipation, which does require a range of treatment options, and is a complaint that involves a moderate risk of morbidity and mortality. The differential diagnoses include constipation, internal bleeding, nonspecific GI disorder. I decided to review old records, and in summary Young female with history of gastric ulcer several years ago, not actively seeing GI, presenting with constipation not amenable to treatment with Colace.  PCP started her on omeprazole and Zofran, several days ago.  She is presenting today, after seeing her PCP, with suggestion for further testing in the ED setting.  I did not require additional historical information from anyone.  Clinical Laboratory Tests Ordered, included stool testing for occult blood, CBC and Metabolic panel. Review indicates normal findings.   Critical Interventions-clinical evaluation, laboratory testing, observation, discussion with patient  After These Interventions, the Patient was reevaluated and was found stable for discharge.  Patient with multiple symptoms, GI related, associated constipation.  Clinically stable, with reassuring laboratory findings.  She is candidate for discharge.  Patient I discussed CT imaging, and she is choosing to not have that done  today.  Patient frustrated that all of the things that her primary care provider suggested that she get done were not done today.  Patient does not have indicators for hospitalization, or further ED evaluation at this time.  CRITICAL CARE-no Performed by: Mancel Bale  Nursing Notes Reviewed/ Care Coordinated Applicable Imaging Reviewed Interpretation of Laboratory Data incorporated into ED treatment  The patient appears reasonably screened and/or stabilized for discharge and I doubt any other medical condition or other [facility] requiring further screening, evaluation, or treatment in the ED at this time prior to discharge.  Plan: Home Medications-continue current prescribed medications, and use either MiraLAX or magnesium citrate instead of Colace for constipation; Home Treatments-fiber and fluid in diet; return here if the recommended treatment, does not improve the symptoms; Recommended follow up-PCP follow-up for ongoing management.     Final Clinical Impression(s) / ED Diagnoses Final diagnoses:  Constipation, unspecified constipation type    Rx / DC Orders ED Discharge Orders         Ordered    metoCLOPramide (REGLAN) 10 MG tablet  Every 6 hours PRN        03/28/21 1620           Mancel Bale, MD 03/28/21 1624

## 2021-03-28 NOTE — ED Triage Notes (Signed)
Pt sent by PCP for concern of small bowel obstruction or gastric ulcer complications. She has had constipation for the past month, passing small hard stools.

## 2021-03-28 NOTE — Discharge Instructions (Addendum)
Your symptoms seem to be related to constipation, and not other problems such as gastric ulcer or small bowel obstruction.  We discussed various treatment options.  We can do a CAT scan today but you do not want to proceed with that.  I suspect that a CAT scan of the abdomen pelvis today would not show the cause of your discomfort.  All of the blood tests that we did, and the test for blood in the stool were negative.  Treating constipation seems to be the best process at this time.  I recommend using MiraLAX, 3 times a day until you have several daily soft bowel movements.  After that decrease to twice a day, for a week then once a day for 2 months.  If you do not want to try MiraLAX, you can use magnesium citrate, 1 bottle each day for 3 days, to help you move your bowels.  Eating a high-fiber diet can be helpful.  Drinking 2 L of water a day will help as well.  You can always return to the emergency department if your symptoms worsen or you have other concerns.  Ask your PCP about referral to a gastroenterologist for further care and treatment.  They can see you and evaluate you for the need for upper endoscopy.  I am going to give you a prescription for Reglan which can help with nausea, and improve gastrointestinal motility.  You can try taking that but be careful because it can make you sleepy.  You can use that with your other nausea medicine (Zofran/omeprazole).

## 2021-03-29 ENCOUNTER — Other Ambulatory Visit: Payer: Self-pay | Admitting: Physician Assistant

## 2021-03-29 DIAGNOSIS — R1084 Generalized abdominal pain: Secondary | ICD-10-CM

## 2021-04-11 ENCOUNTER — Inpatient Hospital Stay: Admission: RE | Admit: 2021-04-11 | Payer: 59 | Source: Ambulatory Visit

## 2021-04-12 ENCOUNTER — Ambulatory Visit
Admission: RE | Admit: 2021-04-12 | Discharge: 2021-04-12 | Disposition: A | Discharge status: Home / Self Care | Payer: 59 | Source: Ambulatory Visit | Attending: Physician Assistant | Admitting: Physician Assistant

## 2021-04-12 ENCOUNTER — Other Ambulatory Visit: Payer: 59

## 2021-04-12 DIAGNOSIS — R1084 Generalized abdominal pain: Secondary | ICD-10-CM

## 2021-04-12 MED ORDER — IOPAMIDOL (ISOVUE-370) INJECTION 76%
80.0000 mL | Freq: Once | INTRAVENOUS | Status: AC | PRN
  Administered 2021-04-12: 80 mL via INTRAVENOUS

## 2021-04-22 ENCOUNTER — Emergency Department (HOSPITAL_COMMUNITY)
Admission: EM | Admit: 2021-04-22 | Discharge: 2021-04-27 | Disposition: A | Discharge status: Home / Self Care | Payer: 59 | Attending: Emergency Medicine | Admitting: Emergency Medicine

## 2021-04-22 ENCOUNTER — Other Ambulatory Visit: Payer: Self-pay

## 2021-04-22 ENCOUNTER — Encounter (HOSPITAL_COMMUNITY): Payer: Self-pay

## 2021-04-22 DIAGNOSIS — Y909 Presence of alcohol in blood, level not specified: Secondary | ICD-10-CM | POA: Diagnosis not present

## 2021-04-22 DIAGNOSIS — F23 Brief psychotic disorder: Secondary | ICD-10-CM

## 2021-04-22 DIAGNOSIS — J45909 Unspecified asthma, uncomplicated: Secondary | ICD-10-CM | POA: Insufficient documentation

## 2021-04-22 DIAGNOSIS — F17210 Nicotine dependence, cigarettes, uncomplicated: Secondary | ICD-10-CM | POA: Diagnosis not present

## 2021-04-22 DIAGNOSIS — Z046 Encounter for general psychiatric examination, requested by authority: Secondary | ICD-10-CM | POA: Diagnosis present

## 2021-04-22 DIAGNOSIS — Z20822 Contact with and (suspected) exposure to covid-19: Secondary | ICD-10-CM | POA: Insufficient documentation

## 2021-04-22 DIAGNOSIS — F312 Bipolar disorder, current episode manic severe with psychotic features: Secondary | ICD-10-CM | POA: Insufficient documentation

## 2021-04-22 LAB — RAPID URINE DRUG SCREEN, HOSP PERFORMED
Amphetamines: NOT DETECTED
Barbiturates: NOT DETECTED
Benzodiazepines: POSITIVE — AB
Cocaine: NOT DETECTED
Opiates: NOT DETECTED
Tetrahydrocannabinol: POSITIVE — AB

## 2021-04-22 LAB — COMPREHENSIVE METABOLIC PANEL
ALT: 14 U/L (ref 0–44)
AST: 19 U/L (ref 15–41)
Albumin: 4.5 g/dL (ref 3.5–5.0)
Alkaline Phosphatase: 37 U/L — ABNORMAL LOW (ref 38–126)
Anion gap: 10 (ref 5–15)
BUN: 13 mg/dL (ref 6–20)
CO2: 22 mmol/L (ref 22–32)
Calcium: 9.4 mg/dL (ref 8.9–10.3)
Chloride: 106 mmol/L (ref 98–111)
Creatinine, Ser: 0.82 mg/dL (ref 0.44–1.00)
GFR, Estimated: >60 mL/min (ref >60)
Glucose, Bld: 99 mg/dL (ref 70–99)
Potassium: 3.6 mmol/L (ref 3.5–5.1)
Sodium: 138 mmol/L (ref 135–145)
Total Bilirubin: 0.9 mg/dL (ref 0.3–1.2)
Total Protein: 8 g/dL (ref 6.5–8.1)

## 2021-04-22 LAB — LIPID PANEL
Cholesterol: 158 mg/dL (ref 0–200)
HDL: 45 mg/dL (ref >40)
LDL Cholesterol: 99 mg/dL (ref 0–99)
Total CHOL/HDL Ratio: 3.5 RATIO
Triglycerides: 68 mg/dL (ref <150)
VLDL: 14 mg/dL (ref 0–40)

## 2021-04-22 LAB — CBC WITH DIFFERENTIAL/PLATELET
Abs Immature Granulocytes: 0.03 10*3/uL (ref 0.00–0.07)
Basophils Absolute: 0 10*3/uL (ref 0.0–0.1)
Basophils Relative: 1 %
Eosinophils Absolute: 0 10*3/uL (ref 0.0–0.5)
Eosinophils Relative: 0 %
HCT: 36.1 % (ref 36.0–46.0)
Hemoglobin: 12.1 g/dL (ref 12.0–15.0)
Immature Granulocytes: 0 %
Lymphocytes Relative: 19 %
Lymphs Abs: 1.6 10*3/uL (ref 0.7–4.0)
MCH: 32 pg (ref 26.0–34.0)
MCHC: 33.5 g/dL (ref 30.0–36.0)
MCV: 95.5 fL (ref 80.0–100.0)
Monocytes Absolute: 0.8 10*3/uL (ref 0.1–1.0)
Monocytes Relative: 10 %
Neutro Abs: 5.8 10*3/uL (ref 1.7–7.7)
Neutrophils Relative %: 70 %
Platelets: 228 10*3/uL (ref 150–400)
RBC: 3.78 MIL/uL — ABNORMAL LOW (ref 3.87–5.11)
RDW: 13.1 % (ref 11.5–15.5)
WBC: 8.3 10*3/uL (ref 4.0–10.5)
nRBC: 0 % (ref 0.0–0.2)

## 2021-04-22 LAB — ETHANOL: Alcohol, Ethyl (B): <10 mg/dL (ref <10)

## 2021-04-22 LAB — RESP PANEL BY RT-PCR (FLU A&B, COVID) ARPGX2
Influenza A by PCR: NEGATIVE
Influenza B by PCR: NEGATIVE
SARS Coronavirus 2 by RT PCR: NEGATIVE

## 2021-04-22 LAB — HCG, QUANTITATIVE, PREGNANCY: hCG, Beta Chain, Quant, S: <1 m[IU]/mL (ref <5)

## 2021-04-22 MED ORDER — LORAZEPAM 2 MG/ML IJ SOLN
2.0000 mg | Freq: Once | INTRAMUSCULAR | Status: AC
  Administered 2021-04-22: 2 mg via INTRAMUSCULAR
  Filled 2021-04-22: qty 1

## 2021-04-22 MED ORDER — DROPERIDOL 2.5 MG/ML IJ SOLN
1.2500 mg | Freq: Once | INTRAMUSCULAR | Status: AC
  Administered 2021-04-22: 1.25 mg via INTRAMUSCULAR
  Filled 2021-04-22: qty 2

## 2021-04-22 MED ORDER — ZIPRASIDONE MESYLATE 20 MG IM SOLR: 20.0000 mg | Freq: Once | INTRAMUSCULAR | Status: AC

## 2021-04-22 MED ORDER — STERILE WATER FOR INJECTION IJ SOLN
INTRAMUSCULAR | Status: AC
  Filled 2021-04-22: qty 10

## 2021-04-22 MED ORDER — STERILE WATER FOR INJECTION IJ SOLN
INTRAMUSCULAR | Status: AC
  Administered 2021-04-22: 2.1 mL
  Filled 2021-04-22: qty 10

## 2021-04-22 MED ORDER — ZIPRASIDONE MESYLATE 20 MG IM SOLR
20.0000 mg | Freq: Once | INTRAMUSCULAR | Status: AC
  Administered 2021-04-22: 20 mg via INTRAMUSCULAR
  Filled 2021-04-22: qty 20

## 2021-04-22 MED ORDER — DIPHENHYDRAMINE HCL 50 MG/ML IJ SOLN
25.0000 mg | Freq: Once | INTRAMUSCULAR | Status: AC
  Administered 2021-04-22: 25 mg via INTRAMUSCULAR
  Filled 2021-04-22: qty 1

## 2021-04-22 MED ORDER — OLANZAPINE 5 MG PO TBDP
5.0000 mg | ORAL_TABLET | Freq: Three times a day (TID) | ORAL | Status: DC | PRN
  Administered 2021-04-23: 5 mg via ORAL
  Filled 2021-04-22 (×2): qty 1

## 2021-04-22 MED ORDER — ZIPRASIDONE MESYLATE 20 MG IM SOLR
20.0000 mg | INTRAMUSCULAR | Status: AC | PRN
  Administered 2021-04-22: 20 mg via INTRAMUSCULAR
  Filled 2021-04-22: qty 20

## 2021-04-22 MED ORDER — LORAZEPAM 2 MG/ML IJ SOLN
INTRAMUSCULAR | Status: AC
  Administered 2021-04-22: 2 mg via INTRAMUSCULAR
  Filled 2021-04-22: qty 1

## 2021-04-22 MED ORDER — ZIPRASIDONE MESYLATE 20 MG IM SOLR
INTRAMUSCULAR | Status: AC
  Administered 2021-04-22: 20 mg via INTRAMUSCULAR
  Filled 2021-04-22: qty 20

## 2021-04-22 MED ORDER — LORAZEPAM 1 MG PO TABS: 1.0000 mg | ORAL_TABLET | ORAL | Status: DC | PRN

## 2021-04-22 MED ORDER — LORAZEPAM 2 MG/ML IJ SOLN: 2.0000 mg | Freq: Once | INTRAMUSCULAR | Status: AC

## 2021-04-22 NOTE — ED Notes (Signed)
Unable to ask SI/HI questions due to patients altered mental status.
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
## 2021-04-22 NOTE — ED Triage Notes (Signed)
Patient brought via [facility] for agitation. Patient was picked up from home after her mom called stating the patient has become more agitated over the last week. Hx of bipolar disorder.

## 2021-04-22 NOTE — ED Notes (Signed)
Patient is asleep.  
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
## 2021-04-22 NOTE — ED Provider Notes (Addendum)
Emergency Medicine Observation Re-evaluation Note  Sierra Ortiz is a 35 y.o. female, seen on rounds today.  Pt initially presented to the ED for complaints of Altered Mental Status Currently, the patient is held on IVC.  She is agitated threatening to sue, wants everyone to burn and hell because we are holding her here against her will.  Physical Exam  BP 136/86   Pulse 89   Temp 98.3 F (36.8 C) (Oral)   Resp 14   SpO2 100%  Physical Exam General: agitated, combative  Lungs: breathing easily, speaking in full sentences  Psych: agitated, combative  ED Course / MDM  EKG:EKG Interpretation  Date/Time:  Sunday Apr 22 2021 04:28:02 EDT Ventricular Rate:  101 PR Interval:  128 QRS Duration: 70 QT Interval:  337 QTC Calculation: 437 R Axis:   76 Text Interpretation: Sinus tachycardia Borderline repolarization abnormality No previous ECGs available Confirmed by Kennis Carina [phone]) on 04/22/2021 4:30:57 AM   I have reviewed the labs performed to date as well as medications administered while in observation.  Recent changes in the last 24 hours include recurrent agitation.  Plan  Current plan is for psych stabilization.  Additional IM geodon and ativan ordered 9:11 AM . Restraints placed.  Will attempt to remove restraints once meds are given. Patient is under full IVC at this time.      Linwood Dibbles, MD 04/22/21 [phone]

## 2021-04-22 NOTE — ED Notes (Signed)
Patient was given her lunch tray. 

## 2021-04-22 NOTE — Consult Note (Signed)
  Sierra Ortiz is a 35 year old female with history of bipolar disorder presented to [facility] via IVC by [facility] for agitation. Since admission patient remains agitated, combative, and delusional refusing care and becoming assaultive towards staff; forced medications and restraints have been used. TTS has been unable to complete assessment as a result.   Plan:  -Agitation orders initiated: EKG completed; normal -Awaiting TTS assessment

## 2021-04-22 NOTE — ED Notes (Signed)
Patients dinner tray was placed on her bedside table. Patient was asleep.

## 2021-04-22 NOTE — ED Notes (Signed)
Pt awoke and became argumentative and increasingly agitated again. Pt was medicated with earlier HELD medication.

## 2021-04-22 NOTE — ED Notes (Signed)
Pts belongings shoes, pants, shirt, are in rms 1-4 cabinets

## 2021-04-22 NOTE — ED Notes (Signed)
Pt came out of the room and started walking away. She has attempted to leave on previous shift. Patient escorted back to her room and refused to agree to stay in her room and began to try to run again.

## 2021-04-22 NOTE — ED Notes (Signed)
Pt remains unrestrained from physical restraint at this time. Will monitor post medication administration for calmness and cooperation to continue leaving Pt unrestrained. Pt explained need for compliance with staff requests to remain in room and refrain from aggressive behavior to remain so.

## 2021-04-22 NOTE — ED Notes (Signed)
[place] PD is at bedside. Patient is now lying in bed. She is changed into scrubs. Labs were drawn. Patient has not yet void for urine.

## 2021-04-22 NOTE — ED Notes (Signed)
Pt currently asleep, prior medication effective, restraints removed. Order for restraints left active if needed.

## 2021-04-22 NOTE — BH Assessment (Incomplete)
Attempted to assess patient.  Advised by attending RN that Pt is chemically restrained and asleep.

## 2021-04-22 NOTE — BH Assessment (Signed)
Clinician made contact with pt's providers in an attempt to complete pt's MH Assessment. Pt's nurse, Varney Baas, stated pt's medications have taken effect and that she cannot participate in her assessment at this time. TTS will attempt assessment at a later time; pt's nurse stated she would contact clinician when pt is awake again.

## 2021-04-22 NOTE — ED Provider Notes (Addendum)
[facility]ENCY DEPT [facility] Emergency Department Provider Note MRN:  [id number]  Arrival date & time: 04/22/21     Chief Complaint   IVC History of Present Illness   Sierra Ortiz is a 35 y.o. year-old female with a history of bipolar disorder presenting to the ED with chief complaint of IVC.  Abnormal behavior worsening over the past week.  IVC being obtained by family.  Brought in by police.  Combative, screaming.  Seems to be talking to herself, not making sense.  I was unable to obtain an accurate HPI, PMH, or ROS due to the patient's unstable psychiatric condition.  Level 5 caveat.  Review of Systems  Positive for abnormal behavior.  Patient's Health History    Past Medical History:  Diagnosis Date  . Asthma   . Gastric ulcer   . IBS (irritable bowel syndrome)     Past Surgical History:  Procedure Laterality Date  . ABDOMINAL HYSTERECTOMY     partial     History reviewed. No pertinent family history.  Social History   Socioeconomic History  . Marital status: Single    Spouse name: Not on file  . Number of children: Not on file  . Years of education: Not on file  . Highest education level: Not on file  Occupational History  . Not on file  Tobacco Use  . Smoking status: Current Every Day Smoker    Packs/day: 0.20    Types: Cigarettes  . Smokeless tobacco: Never Used  Substance and Sexual Activity  . Alcohol use: Yes    Comment: rarely  . Drug use: No  . Sexual activity: Not on file  Other Topics Concern  . Not on file  Social History Narrative  . Not on file   Social Determinants of Health   Financial Resource Strain: Not on file  Food Insecurity: Not on file  Transportation Needs: Not on file  Physical Activity: Not on file  Stress: Not on file  Social Connections: Not on file  Intimate Partner Violence: Not on file     Physical Exam   Vitals:   04/22/21 0345 04/22/21 0400  BP: 121/67 108/63  Pulse: 97 94  Resp: 20 15  Temp:     SpO2: 98% 97%    CONSTITUTIONAL: Well-appearing, screaming and flailing on the ground NEURO:  Alert and oriented x 3, no focal deficits EYES:  eyes equal and reactive ENT/NECK:  no LAD, no JVD CARDIO: Regular rate, well-perfused, normal S1 and S2 PULM:  CTAB no wheezing or rhonchi GI/GU:  normal bowel sounds, non-distended, non-tender MSK/SPINE:  No gross deformities, no edema SKIN:  no rash, atraumatic PSYCH: Erratic speech and behavior  *Additional and/or pertinent findings included in MDM below  Diagnostic and Interventional Summary    EKG Interpretation  Date/Time:  Sunday Apr 22 2021 04:28:02 EDT Ventricular Rate:  101 PR Interval:  128 QRS Duration: 70 QT Interval:  337 QTC Calculation: 437 R Axis:   76 Text Interpretation: Sinus tachycardia Borderline repolarization abnormality No previous ECGs available Confirmed by Kennis Carina [phone]) on 04/22/2021 4:30:57 AM      Labs Reviewed  COMPREHENSIVE METABOLIC PANEL - Abnormal; Notable for the following components:      Result Value   Alkaline Phosphatase 37 (*)    All other components within normal limits  CBC WITH DIFFERENTIAL/PLATELET - Abnormal; Notable for the following components:   RBC 3.78 (*)    All other components within normal limits  RESP PANEL BY RT-PCR (  FLU A&B, COVID) ARPGX2  ETHANOL  HCG, QUANTITATIVE, PREGNANCY  RAPID URINE DRUG SCREEN, HOSP PERFORMED    No orders to display    Medications  sterile water (preservative free) injection (2.1 mLs  Given 04/22/21 0252)  ziprasidone (GEODON) injection 20 mg (20 mg Intramuscular Given 04/22/21 0220)  LORazepam (ATIVAN) injection 2 mg (2 mg Intramuscular Given 04/22/21 0328)     Procedures  /  Critical Care .Critical Care Performed by: Sabas Sous, MD Authorized by: Sabas Sous, MD   Critical care provider statement:    Critical care time (minutes):  32   Critical care was necessary to treat or prevent imminent or life-threatening deterioration  of the following conditions: Unstable psychiatric condition.   Critical care was time spent personally by me on the following activities:  Discussions with consultants, evaluation of patient's response to treatment, examination of patient, ordering and performing treatments and interventions, ordering and review of laboratory studies, ordering and review of radiographic studies, pulse oximetry, re-evaluation of patient's condition, obtaining history from patient or surrogate and review of old charts    ED Course and Medical Decision Making  I have reviewed the triage vital signs, the nursing notes, and pertinent available records from the EMR.  Listed above are laboratory and imaging tests that I personally ordered, reviewed, and interpreted and then considered in my medical decision making (see below for details).  Concern for acute psychosis, history of bipolar disorder.  Patient requiring restraint and Geodon.  Seems to be a bit more calm now.  Moving all extremities equally, no fever, normal vital signs, I do not see any obvious medical reason for her erratic behavior.  Awaiting labs for medical clearance.     Patient is medically cleared, signed out to default provider.  Elmer Sow. Pilar Plate, MD [facility] Emergency Medicine [facility] [email or web address]  Final Clinical Impressions(s) / ED Diagnoses     ICD-10-CM   1. Acute psychosis (HCC)  F23     ED Discharge Orders    None       Discharge Instructions Discussed with and Provided to Patient:   Discharge Instructions   None       Sabas Sous, MD 04/22/21 1696    Sabas Sous, MD 04/22/21 [phone]

## 2021-04-22 NOTE — ED Notes (Signed)
Patient is in room. At time she is calm and quiet at other times she tries to stand on the bed. Patient becomes agitated and then quiets herself. Patient appears to be talking to herself "stating have mercy, show me the way" and asking for her mom. Verbal Order from Dr Virgel Bouquet for 20MG  Geodon IM.

## 2021-04-23 DIAGNOSIS — F312 Bipolar disorder, current episode manic severe with psychotic features: Secondary | ICD-10-CM | POA: Diagnosis not present

## 2021-04-23 MED ORDER — ZIPRASIDONE MESYLATE 20 MG IM SOLR
20.0000 mg | Freq: Once | INTRAMUSCULAR | Status: AC
  Administered 2021-04-23: 20 mg via INTRAMUSCULAR
  Filled 2021-04-23: qty 20

## 2021-04-23 MED ORDER — LORAZEPAM 2 MG/ML IJ SOLN
2.0000 mg | Freq: Once | INTRAMUSCULAR | Status: AC
  Administered 2021-04-23: 2 mg via INTRAMUSCULAR
  Filled 2021-04-23: qty 1

## 2021-04-23 MED ORDER — LORAZEPAM 2 MG/ML IJ SOLN
2.0000 mg | Freq: Once | INTRAMUSCULAR | Status: AC | PRN
  Administered 2021-04-23: 2 mg via INTRAMUSCULAR
  Filled 2021-04-23: qty 1

## 2021-04-23 MED ORDER — LORAZEPAM 2 MG/ML IJ SOLN
2.0000 mg | Freq: Four times a day (QID) | INTRAMUSCULAR | Status: DC | PRN
  Administered 2021-04-23 – 2021-04-25 (×3): 2 mg via INTRAMUSCULAR
  Filled 2021-04-23 (×3): qty 1

## 2021-04-23 MED ORDER — LORAZEPAM 2 MG/ML IJ SOLN: 2.0000 mg | Freq: Four times a day (QID) | INTRAMUSCULAR | Status: DC | PRN

## 2021-04-23 MED ORDER — DIPHENHYDRAMINE HCL 50 MG/ML IJ SOLN
50.0000 mg | Freq: Once | INTRAMUSCULAR | Status: AC
  Administered 2021-04-23: 50 mg via INTRAMUSCULAR
  Filled 2021-04-23: qty 1

## 2021-04-23 MED ORDER — STERILE WATER FOR INJECTION IJ SOLN
INTRAMUSCULAR | Status: AC
  Filled 2021-04-23: qty 10

## 2021-04-23 NOTE — ED Notes (Signed)
Patient has removed restraint. Reapplied restraint.

## 2021-04-23 NOTE — ED Notes (Signed)
Pt transported to TCU. IVC paperwork given to RN

## 2021-04-23 NOTE — ED Notes (Signed)
Patient screaming "NURSE". Patient explained what the medication is used for prior to administration.

## 2021-04-23 NOTE — ED Notes (Signed)
Pt to room 27. Pt drowsy, sleeping,  Restraints DCd. Sitter at bedside.

## 2021-04-23 NOTE — ED Notes (Signed)
Redness noted to bilateral wrist from pulling on restraints.

## 2021-04-23 NOTE — ED Notes (Signed)
Patient yelling at staff to take her out of restraints, education provided. Patient pulling at restraints and trying to remove them. Redness noted to extremities. Patient shaking bedrails. Restraints re-applied for patients comfort. Patient is crying and unable to understand patient.

## 2021-04-23 NOTE — ED Notes (Signed)
Pt yelling "I am going to kill [place] all when she gets out of here"

## 2021-04-23 NOTE — ED Provider Notes (Signed)
Emergency Medicine Observation Re-evaluation Note  Sierra Ortiz is a 35 y.o. female, seen on rounds today.  Pt initially presented to the ED for complaints of Altered Mental Status Currently, the patient is alert, calm, comfortable.  Physical Exam  BP 105/81   Pulse 91   Temp 98.3 F (36.8 C) (Oral)   Resp 17   SpO2 99%  Physical Exam General: calm, directable, comfortable    ED Course / MDM  EKG:EKG Interpretation  Date/Time:  Sunday Apr 22 2021 04:28:02 EDT Ventricular Rate:  101 PR Interval:  128 QRS Duration: 70 QT Interval:  337 QTC Calculation: 437 R Axis:   76 Text Interpretation: Sinus tachycardia Borderline repolarization abnormality No previous ECGs available Confirmed by Kennis Carina [phone]) on 04/22/2021 4:30:57 AM   I have reviewed the labs performed to date as well as medications administered while in observation.  Recent changes in the last 24 hours include agitation requiring medication.  Plan  Current plan is for psych evaluation and placement.    Wynetta Fines, MD 04/23/21 [phone]

## 2021-04-23 NOTE — ED Notes (Signed)
Patient ambulatory to restroom with staff

## 2021-04-23 NOTE — ED Notes (Signed)
Called mother back outside of room due to patient yelling. Explained to mother that all medication being administered pt is educated on. Explained that patient has a Recruitment consultant.

## 2021-04-23 NOTE — ED Notes (Signed)
Pt requesting something to drink, juice provided

## 2021-04-23 NOTE — ED Notes (Signed)
Patient removed bilateral wrist soft restraints and right leg ankle restraint and climbing out of bed. security called and restraints re-applied. Patient is rambling.

## 2021-04-23 NOTE — BH Assessment (Addendum)
This Probation officer met with patient earlier this date to assess current mental health status. Patient is observed to be in restraints and is yelling at this writer to "let her go." This Probation officer attempted to gather information and history unsuccessfully to assist with ongoing care. Patient contnues to contnues to scream to let her go. Patient contnues to exhibit a AMS with UDS positive for THC and Benzodiazepines this date. Patient contnues to meet inpatient criteria.

## 2021-04-23 NOTE — ED Notes (Signed)
Patient attempting to remove restraints and pulling wrist against restraints.

## 2021-04-23 NOTE — ED Notes (Signed)
Patient attempting to bite staff and pinch staff and grab arms and wrist when attempted to stop patient from removing restraints. security called to room for assistance. Restraints reapplied. MD aware

## 2021-04-23 NOTE — ED Notes (Signed)
Patient in room trying to remove restraints and posey belt. Restraints removed and repositioned. Patient asking for restraints to be removed. Explained the use for the restraints. Pt asking for phone call explained that when she talked on phone yesterday she had to be medicated due to her behavior.

## 2021-04-23 NOTE — ED Notes (Signed)
Patient continues to scream "NURSE". Sitter asked patient how she could help her and pateint continues to scream.

## 2021-04-23 NOTE — BH Assessment (Addendum)
Per Denice Paradise, LCAS, TTS note, patient continues to meet criteria for inpatient psychiatric treatment.  Disposition Counselor sent secure chat to  [facility] Everardo Pacific, RN). Provided updates on patient's bed needs and asked to review patient for admission to [facility]. Per [facility], no appropriate [facility] beds at this time. Please reassess with morning AC pending discharges. Morning Disposition Counselor to follow up. Patient's nurse Reita Cliche, RN), provided disposition updates.

## 2021-04-23 NOTE — ED Provider Notes (Addendum)
  Physical Exam  BP 109/77   Pulse 89   Temp 98.3 F (36.8 C) (Oral)   Resp 16   SpO2 100%   Physical Exam  ED Course/Procedures   Clinical Course as of 04/23/21 0336  Sun Apr 22, 2021  1011 Patient still very agitated.  Yelling out.  Distress.  Did not respond to the initial 20 of Geodon.  Patient's QTC is not prolonged.  Will give a dose of Geodon Benadryl and Ativan [JK]    Clinical Course User Index [JK] Linwood Dibbles, MD    Procedures  MDM   Patient became more agitated and yelling.  In the last 25 hours has had Geodon 3 times.  At this point we will add some Ativan.  QTC was not prolonged yesterday but has had more than the maximum dose of Geodon already.  Continues to yell and scream.  Will give more Ativan     Benjiman Core, MD 04/23/21 3300    Benjiman Core, MD 04/23/21 [phone]

## 2021-04-23 NOTE — ED Notes (Signed)
Patient rocking back and forth in bed with bilateral wrist restraints and posey belt stating "I want to see my daughter" offered patient a Zyprexa and explained what the medication was for. Pt refused to take it. Patient continued to cry and state "I just want to see and speak to my daughter".

## 2021-04-23 NOTE — ED Notes (Signed)
Unable to obtain VS at this time due to agitation and pt combative. Will defer until calmer.

## 2021-04-23 NOTE — ED Notes (Signed)
Patient screaming "nurse" sitter asked pt what she needed and she continues to scream.

## 2021-04-23 NOTE — ED Notes (Signed)
Patient is yelling "where is my daughter". Patient shaking bed rails and trying to remove restraints. MD notified.

## 2021-04-23 NOTE — ED Notes (Signed)
Patient pulled right leg out of restraint and sticking it between the bed rail. Patient states "if I have to be tied up then I am going flip the bed and get out of here"

## 2021-04-23 NOTE — ED Notes (Signed)
Removed patients right wrist restraint per her request. Explained to her that if she doesn't try to remove other restraints and leave then we will slowly remove her restraints. As soon as sitter turns her head patient is removing all restraints. Restraints reapplied to bilateral wrist, ankle and posey belt.

## 2021-04-23 NOTE — Progress Notes (Signed)
Per Patty RN: Patient is in restraints and yelling.She is combative with staff and biting. Consulted Case with Dr. Lucianne Muss  Will initiate 20 mg Geodon IM one time dose, Benadryl 50 mg IM one time dose and Ativan 2 mg Q6H PRN for severe agitation.

## 2021-04-23 NOTE — ED Notes (Signed)
Patient pulled right wrist out of soft restraint. Restraints reapplied. Pt requesting to call mother. Explained to pt can quit screaming then we will provided her phone privileges. Security in room due to patient grabbing nurses wrist and pinching them. Attempted to applied mittens but unsuccessful.

## 2021-04-23 NOTE — ED Notes (Signed)
Pt attempting to remove restraint.

## 2021-04-23 NOTE — ED Notes (Signed)
Pt sleeping comfortably.  Sitter at bedside.

## 2021-04-23 NOTE — ED Notes (Signed)
Patient pulling at left wrist restraint trying to undo it. Restraints reapplied. Educated patient about restraints.

## 2021-04-23 NOTE — ED Notes (Signed)
Patient provided warm blanket per request. After warm blanket patient continues to scream for blanket

## 2021-04-23 NOTE — ED Notes (Signed)
Patient screaming out demanding phone. Patient screaming "NURSE I WANT PHONE" asked patient to please corporate and lower her voice and I will provide phone and she just screamed louder.

## 2021-04-23 NOTE — BH Assessment (Signed)
Comprehensive Clinical Assessment (CCA) Note  04/23/2021 Sierra Ortiz [id number]  Recommendations for Services/Supports/Treatments: Melbourne Abts, PA, reviewed pt's chart and information and determined pt meets inpatient criteria. This information was provided to pt's providers at 0559.  The patient demonstrates the following risk factors for suicide: Chronic risk factors for suicide include: psychiatric disorder of Bipolar Disorder w/ Psychotic Features. Acute risk factors for suicide include: family or marital conflict. Protective factors for this patient include: responsibility to others (children, family) and hope for the future. Considering these factors, the overall suicide risk at this point appears to be none. Patient is not appropriate for outpatient follow up.  Therefore, no sitter is necessary for suicide prevention.  Flowsheet Row ED from 04/22/2021 in [facility] from 03/28/2021 in [facility]  C-SSRS RISK CATEGORY No Risk No Risk     Chief Complaint:  Chief Complaint  Patient presents with  . Altered Mental Status   Visit Diagnosis: F31.2, Bipolar I disorder, Current or most recent episode manic, with psychotic features  CCA Screening, Triage and Referral (STR) Sierra Ortiz is a 35 year old patient who was brought to the [facility] under IVC paperwork that was completed by her mother. The IVC states:  "Respondent is bipolar and has manic depression. She has not ate since Wednesday and has not slept for 2 days. Tonight repsondent tore her apartment up by throwing things and breaking things. Respondent also attempted to jump off the 2nd floor balcony while family members restrained her from jumping. Respondent believes people are watching her, controlling her, and trying to set her up."  Pt began the assessment refusing to answer questions and instead shrugging her shoulders. Clinician informed pt that she did not  have to participate in the assessment but that she would not be d/c from the hospital until the assessment was complete. At that time, pt began sharing limited information and, as the assessment progressed, pt began communicating more openly.  Pt stated she was only experiencing suicidal thoughts if she is "not out of this hospital in 24 hours; that's the only way I'm suicidal." When asked about previous SI or any attempts to harm herself, pt stated, "that's none of your business."  Pt denies HI, current AVH (pt states, "I used to; I don't now."), NSSIB, or access to guns. Pt states she is unsure about any upcoming court dates. Pt shares she smokes 3 blunts of marijuana on a daily basis.  Pt shares she had been therapist Annia Friendly at [place] and that she had also been seeing a psychiatrist; she cannot identify when her last appointment was. Pt states she had most recently been prescribed Seroquel, though it doesn't work. She states a previous prescription for Trazadone also did not work for her.  When asked if there was anything else pt would like to share, pt states, "they've got cameras up in [my home]. [It's been going on] for years. My ear keeps popping, telling me not to tell y'all." Pt goes on to share that the cameras watch everyone (they're in everyone's homes) and that "they're behind it," though she states she does not know who "they" are. Pt requested to know if clinician thought she was crazy and clinician responded, "I think you happen to have insight into things that other people don't have." Pt expressed she was satisfied with clinician's answer and thanked clinician for her time.  Pt asked when she would be able to be d/c and clinician stated she would  be re-assessed in the morning and it would be determined at that time if she could be d/c or if she would need to remain in the hospital at that time. Pt expressed an understanding.  Pt is oriented x5. Her recent/remote memory is intact. Pt  was cooperative throughout the assessment process. Pt's insight, judgement, and impulse control is poor at this time.  Patient Reported Information How did you hear about [place]? Other (Comment) (Pt was [facility])  Referral name: Sterling Big, mother  Referral phone number: [phone]   Whom do you see for routine medical problems? I don't have a doctor  Practice/Facility Name: No data recorded Practice/Facility Phone Number: No data recorded Name of Contact: No data recorded Contact Number: No data recorded Contact Fax Number: No data recorded Prescriber Name: No data recorded Prescriber Address (if known): No data recorded  What Is the Reason for Your Visit/Call Today? Pt shares she doesn't know. Pt was IVCed by her mother for not eating or sleeping and today she "tore her apartment up" and attempted to jump off the 2nd floor balcony.  How Long Has This Been Causing You Problems? <Week  What Do You Feel Would Help You the Most Today? Treatment for Depression or other mood problem   Have You Recently Been in Any Inpatient Treatment (Hospital/Detox/Crisis Center/28-Day Program)? No  Name/Location of Program/Hospital:No data recorded How Long Were You There? No data recorded When Were You Discharged? No data recorded  Have You Ever Received Services From [facility] Before? Yes  Who Do You See at [facility]? Various providers in the ED   Have You Recently Had Any Thoughts About Hurting Yourself? -- (Pt denies, but the IVC paperwork states pt attempted to jump from her 2nd floor balcony.)  Are You Planning to Commit Suicide/Harm Yourself At This time? -- (Pt denies, but the IVC paperwork states pt attempted to jump from her 2nd floor balcony.)   Have you Recently Had Thoughts About Hurting Someone Sierra Ortiz? No  Explanation: No data recorded  Have You Used Any Alcohol or Drugs in the Past 24 Hours? -- (Unknown, but pt's UDA was positive for THC)  How Long Ago Did You Use Drugs or  Alcohol? No data recorded What Did You Use and How Much? No data recorded  Do You Currently Have a Therapist/Psychiatrist? Yes  Name of Therapist/Psychiatrist: Pt shares she has a psychiatrist and a therapist through [facility]   Have You Been Recently Discharged From Any Office Practice or Programs? No  Explanation of Discharge From Practice/Program: No data recorded    CCA Screening Triage Referral Assessment Type of Contact: Tele-Assessment  Is this Initial or Reassessment? Initial Assessment  Date Telepsych consult ordered in [facility]:  04/22/2021  Time Telepsych consult ordered in [facility]:  0413   Patient Reported Information Reviewed? Yes  Patient Left Without Being Seen? No data recorded Reason for Not Completing Assessment: No data recorded  Collateral Involvement: Pt provided verbal consent for clinician to speak to (and her IVC was filed by) her mother, Sterling Big, at [phone].   Does Patient Have a Automotive engineer Guardian? No data recorded Name and Contact of Legal Guardian: No data recorded If Minor and Not Living with Parent(s), Who has Custody? N/A  Is CPS involved or ever been involved? Never  Is APS involved or ever been involved? Never   Patient Determined To Be At Risk for Harm To Self or Others Based on Review of Patient Reported Information or Presenting Complaint?  Yes, for Self-Harm  Method: No data recorded Availability of Means: No data recorded Intent: No data recorded Notification Required: No data recorded Additional Information for Danger to Others Potential: No data recorded Additional Comments for Danger to Others Potential: No data recorded Are There Guns or Other Weapons in Your Home? No data recorded Types of Guns/Weapons: No data recorded Are These Weapons Safely Secured?                            No data recorded Who Could Verify You Are Able To Have These Secured: No data recorded Do You Have any Outstanding Charges, Pending Court  Dates, Parole/Probation? No data recorded Contacted To Inform of Risk of Harm To Self or Others: Patent examiner; Family/Significant Other: (LEO and pt's family are aware of pt's potential harm to self)   Location of Assessment: [facility] ED   Does Patient Present under Involuntary Commitment? Yes  IVC Papers Initial File Date: 04/22/2021   [place] of Residence: [place]   Patient Currently Receiving the Following Services: Individual Therapy; Medication Management   Determination of Need: Emergent (2 hours)   Options For Referral: Inpatient Hospitalization     CCA Biopsychosocial Intake/Chief Complaint:  Pt shares she doesn't know why she was brought to the ED. Pt was IVCed by her mother for not eating or sleeping and today she "tore her apartment up" and attempted to jump off the 2nd floor balcony.  Current Symptoms/Problems: Pt denies any symptoms. Pt's family reports she is being watched by cameras in her apartment.   Patient Reported Schizophrenia/Schizoaffective Diagnosis in Past: No   Strengths: Not assessed  Preferences: Not assessed  Abilities: Not assessed   Type of Services Patient Feels are Needed: Not assessed   Initial Clinical Notes/Concerns: N/A   Mental Health Symptoms Depression:  Increase/decrease in appetite; Irritability; Sleep (too much or little)   Duration of Depressive symptoms: Less than two weeks   Mania:  Change in energy/activity; Recklessness; Irritability   Anxiety:   Worrying; Irritability   Psychosis:  Delusions   Duration of Psychotic symptoms: Less than six months   Trauma:  None   Obsessions:  None   Compulsions:  None   Inattention:  None   Hyperactivity/Impulsivity:  N/A   Oppositional/Defiant Behaviors:  N/A   Emotional Irregularity:  Mood lability; Potentially harmful impulsivity   Other Mood/Personality Symptoms:  None noted    Mental Status Exam Appearance and self-care  Stature:  Small   Weight:  Thin    Clothing:  -- (Pt is dressed in scrubs)   Grooming:  Normal   Cosmetic use:  None   Posture/gait:  Normal   Motor activity:  Not Remarkable   Sensorium  Attention:  Normal   Concentration:  Normal   Orientation:  X5   Recall/memory:  -- (UTA)   Affect and Mood  Affect:  Negative   Mood:  Irritable   Relating  Eye contact:  Normal   Facial expression:  Responsive   Attitude toward examiner:  Guarded   Thought and Language  Speech flow: Flight of Ideas   Thought content:  Delusions   Preoccupation:  None   Hallucinations:  None (Pt states, "I used to; I don't now.")   Organization:  No data recorded  [employer] of Knowledge:  Average   Intelligence:  Average   Abstraction:  Abstract   Judgement:  Impaired   Reality Testing:  Variable  Insight:  Lacking   Decision Making:  Impulsive   Social Functioning  Social Maturity:  Impulsive   Social Judgement:  "Street Smart"   Stress  Stressors:  Family conflict; Other (Comment) (Delusions)   Coping Ability:  Exhausted   Skill Deficits:  Communication; Self-control   Supports:  Family     Religion: Religion/Spirituality Are You A Religious Person?:  (Not assessed) How Might This Affect Treatment?: Not assessed  Leisure/Recreation: Leisure / Recreation Do You Have Hobbies?:  (Not assessed)  Exercise/Diet: Exercise/Diet Do You Exercise?:  (Not assessed) Have You Gained or Lost A Significant Amount of Weight in the Past Six Months?:  (Not assessed) Do You Follow a Special Diet?:  (Pt's mother shares pt has not eaten since Wednesday (4 days as of the writing of the IVC paperwork)) Do You Have Any Trouble Sleeping?:  (Pt's mother shares pt has not slept for 2 days.)   CCA Employment/Education Employment/Work Situation: Employment / Work Situation Employment situation: Employed Where is patient currently employed?: [employer] long has patient been employed?: 2  years Patient's job has been impacted by current illness:  (Not assessed) What is the longest time patient has a held a job?: Not assessed Where was the patient employed at that time?: Not assessed Has patient ever been in the [employer]?:  (Not assessed)  Education: Education Is Patient Currently Attending School?:  (Not assessed) Last Grade Completed:  (Not assessed) Name of High School: Not assessed Did Garment/textile technologist From [employer]?:  (Not assessed) Did Theme park manager?:  (Not assessed) Did You Attend Graduate School?:  (Not assessed) Did You Have Any Special Interests In School?: Not assessed Did You Have An Individualized Education Program (IIEP):  (Not assessed) Did You Have Any Difficulty At School?:  (Not assessed) Patient's Education Has Been Impacted by Current Illness:  (Not assessed)   CCA Family/Childhood History Family and Relationship History: Family history Marital status:  (Not assessed) Are you sexually active?:  (Not assessed) What is your sexual orientation?: Not assessed Has your sexual activity been affected by drugs, alcohol, medication, or emotional stress?: Not assessed Does patient have children?: Yes How many children?: 1 How is patient's relationship with their children?: Pt shares she has a good relationship with her daughter  Childhood History:  Childhood History By whom was/is the patient raised?:  (Not assessed) Additional childhood history information: Not assessed Description of patient's relationship with caregiver when they were a child: Not assessed Patient's description of current relationship with people who raised him/her: Not assessed How were you disciplined when you got in trouble as a child/adolescent?: Not assessed Does patient have siblings?:  (Not assessed) Did patient suffer any verbal/emotional/physical/sexual abuse as a child?:  (Not assessed) Did patient suffer from severe childhood neglect?:  (Not assessed) Has patient  ever been sexually abused/assaulted/raped as an adolescent or adult?:  (Not assessed) Was the patient ever a victim of a crime or a disaster?:  (Not assessed) Witnessed domestic violence?:  (Not assessed) Has patient been affected by domestic violence as an adult?:  (Not assessed)  Child/Adolescent Assessment:     CCA Substance Use Alcohol/Drug Use: Alcohol / Drug Use Pain Medications: See MAR Prescriptions: See MAR Over the Counter: See MAR History of alcohol / drug use?: Yes Longest period of sobriety (when/how long): Unknown Negative Consequences of Use:  (Pt denies) Withdrawal Symptoms:  (Pt denies) Substance #1 Name of Substance 1: Marijuana 1 - Age of First Use: Unknown 1 - Amount (size/oz):  3 blunts 1 - Frequency: Daily 1 - Duration: Unknown 1 - Last Use / Amount: 04/22/2021 1 - Method of Aquiring: Purchase 1- Route of Use: Smoke                       ASAM's:  Six Dimensions of Multidimensional Assessment  Dimension 1:  Acute Intoxication and/or Withdrawal Potential:      Dimension 2:  Biomedical Conditions and Complications:      Dimension 3:  Emotional, Behavioral, or Cognitive Conditions and Complications:     Dimension 4:  Readiness to Change:     Dimension 5:  Relapse, Continued use, or Continued Problem Potential:     Dimension 6:  Recovery/Living Environment:     ASAM Severity Score:    ASAM Recommended Level of Treatment: ASAM Recommended Level of Treatment:  (N/A)   Substance use Disorder (SUD) Substance Use Disorder (SUD)  Checklist Symptoms of Substance Use:  (N/A)  Recommendations for Services/Supports/Treatments: Recommendations for Services/Supports/Treatments Recommendations For Services/Supports/Treatments: Inpatient Hospitalization  Melbourne Abtsody Taylor, PA, reviewed pt's chart and information and determined pt meets inpatient criteria. This information was provided to pt's providers at 0559.  DSM5 Diagnoses: F31.2, Bipolar I disorder,  Current or most recent episode manic, with psychotic features  Patient Centered Plan: Patient is on the following Treatment Plan(s):  Anxiety and Impulse Control   Referrals to Alternative Service(s): Referred to Alternative Service(s):   Place:   Date:   Time:    Referred to Alternative Service(s):   Place:   Date:   Time:    Referred to Alternative Service(s):   Place:   Date:   Time:    Referred to Alternative Service(s):   Place:   Date:   Time:     Delorse LekSamantha L Tytionna Cloyd, LMFT       Sterling BigRobin Powell, mother [phone]

## 2021-04-23 NOTE — ED Notes (Signed)
Patient removed restraints. Restraints reapplied. Attempted to redirect patient and explain why we are using the restraints and patient continues to yell and scream repeatedly "truth, end, and the light lord". patient pulling at restraints

## 2021-04-23 NOTE — ED Notes (Signed)
Patient provided phone call to call her mother. Patient started screaming and crying. Pt started demanding her mother to come to ER. Patient is tearful at this time.

## 2021-04-23 NOTE — ED Notes (Signed)
Patient attempting to remove restraints again.

## 2021-04-24 DIAGNOSIS — F23 Brief psychotic disorder: Secondary | ICD-10-CM

## 2021-04-24 DIAGNOSIS — F312 Bipolar disorder, current episode manic severe with psychotic features: Secondary | ICD-10-CM

## 2021-04-24 LAB — BASIC METABOLIC PANEL
Anion gap: 9 (ref 5–15)
BUN: 11 mg/dL (ref 6–20)
CO2: 25 mmol/L (ref 22–32)
Calcium: 9.2 mg/dL (ref 8.9–10.3)
Chloride: 103 mmol/L (ref 98–111)
Creatinine, Ser: 1.04 mg/dL — ABNORMAL HIGH (ref 0.44–1.00)
GFR, Estimated: >60 mL/min (ref >60)
Glucose, Bld: 103 mg/dL — ABNORMAL HIGH (ref 70–99)
Potassium: 3.3 mmol/L — ABNORMAL LOW (ref 3.5–5.1)
Sodium: 137 mmol/L (ref 135–145)

## 2021-04-24 MED ORDER — NICOTINE 7 MG/24HR TD PT24
7.0000 mg | MEDICATED_PATCH | Freq: Once | TRANSDERMAL | Status: AC
  Administered 2021-04-24: 7 mg via TRANSDERMAL
  Filled 2021-04-24: qty 1

## 2021-04-24 MED ORDER — OLANZAPINE 10 MG IM SOLR: 10.0000 mg | Freq: Three times a day (TID) | INTRAMUSCULAR | Status: DC | PRN

## 2021-04-24 MED ORDER — OLANZAPINE 10 MG IM SOLR: 5.0000 mg | Freq: Three times a day (TID) | INTRAMUSCULAR | Status: DC | PRN

## 2021-04-24 MED ORDER — OLANZAPINE 10 MG PO TBDP
10.0000 mg | ORAL_TABLET | Freq: Three times a day (TID) | ORAL | Status: DC | PRN
  Administered 2021-04-24 – 2021-04-27 (×4): 10 mg via ORAL
  Filled 2021-04-24 (×4): qty 1

## 2021-04-24 MED ORDER — QUETIAPINE FUMARATE 300 MG PO TABS
300.0000 mg | ORAL_TABLET | Freq: Every day | ORAL | Status: DC
  Administered 2021-04-24 – 2021-04-25 (×2): 300 mg via ORAL
  Filled 2021-04-24 (×3): qty 1

## 2021-04-24 MED ORDER — OLANZAPINE 5 MG PO TBDP
5.0000 mg | ORAL_TABLET | Freq: Three times a day (TID) | ORAL | Status: DC | PRN
Start: 2021-04-24 — End: 2021-04-24

## 2021-04-24 MED ORDER — ZIPRASIDONE MESYLATE 20 MG IM SOLR: 20.0000 mg | Freq: Once | INTRAMUSCULAR | Status: DC | PRN

## 2021-04-24 NOTE — ED Notes (Signed)
After a long conversation related to the events that lead to her admission Sierra Ortiz refuses to take any responsibility for her behavior and states that everyone is lying on her. She denies ever being seen by TTS or any doctor since she arrived in the ED. Pt denies ever attempting to jump off balcony at her apartment but admits that god has been talking to her and that a spirtaul war is in progress.

## 2021-04-24 NOTE — Consult Note (Signed)
[facility] Face-to-Face Psychiatry Consult   Reason for Consult:  Psychiatric assessment for aggressive behaviors. Referring Physician:  Dr. Estell Harpin Patient Identification: Sierra Ortiz MRN:  [id number] Principal Diagnosis: Bipolar affective disorder, current episode manic with psychotic symptoms (HCC) Diagnosis:  Principal Problem:   Bipolar affective disorder, current episode manic with psychotic symptoms (HCC)   Total Time spent with patient: 20 minutes  Subjective:   Sierra Ortiz is a 35 y.o. female patient admitted with IVC.   Sierra Ortiz, 35 y.o., female patient seen face to face by this provider, consulted with Dr. Lucianne Muss; and chart reviewed on 04/24/21.  On evaluation Sierra Ortiz is standing at door way trying to elope yelling at staff.  HPI:    During evaluation Sierra Ortiz is agitated and yelling. "Let me out of here". Unable to access orientation.Her mood is angry with congruent affect. Her appearance is very disheveled. Unable to assess if patient is responding to internal/external stimuli or delusional thoughts due to her agitated state. Also unable to assess if patient is  suicidal/self-harm/homicidal ideation, psychosis, or paranoia.  Patient unable to answer questions logically. She is very disorganized.   Collateral: Sierra Ortiz patients mother. States she initiated the IVC. States that patient last week starting tearing up her apartment and breaking furniture. States patient was hearing voices and became very  paranoid. States that she was hearing voices in the computer and [employer], dispensing. States she stopped taking her mediation,stopped eating and sleeping. She was concerned for her daughters safety and over all well being and she initiated the IVC. States the patient at one time was on lithium but had a sensitivity to it and it was discontinued. States she has initiated IVC in the past when patient condition worsened.   Past Psychiatric History: Per patients mother  Bipolar.  Risk to Self:  unable to assess  Risk to Others:   unable to assess  Prior Inpatient Therapy:   unable to assess  Prior Outpatient Therapy:   unable to assess   Past Medical History:  Past Medical History:  Diagnosis Date  . Asthma   . Gastric ulcer   . IBS (irritable bowel syndrome)     Past Surgical History:  Procedure Laterality Date  . ABDOMINAL HYSTERECTOMY     partial    Family History: History reviewed. No pertinent family history. Family Psychiatric  History: unkown Social History:  Social History   Substance and Sexual Activity  Alcohol Use Yes   Comment: rarely     Social History   Substance and Sexual Activity  Drug Use No    Social History   Socioeconomic History  . Marital status: Single    Spouse name: Not on file  . Number of children: Not on file  . Years of education: Not on file  . Highest education level: Not on file  Occupational History  . Not on file  Tobacco Use  . Smoking status: Current Every Day Smoker    Packs/day: 0.20    Types: Cigarettes  . Smokeless tobacco: Never Used  Substance and Sexual Activity  . Alcohol use: Yes    Comment: rarely  . Drug use: No  . Sexual activity: Not on file  Other Topics Concern  . Not on file  Social History Narrative  . Not on file   Social Determinants of Health   Financial Resource Strain: Not on file  Food Insecurity: Not on file  Transportation Needs: Not on file  Physical Activity: Not on file  Stress:  Not on file  Social Connections: Not on file   Additional Social History:    Allergies:   Allergies  Allergen Reactions  . Aspirin Other (See Comments) and Nausea And Vomiting    "Makes me talk out of my head." Other reaction(s): Dizziness (intolerance) "Makes me talk out of my head."  . Hydrocodone-Acetaminophen Nausea And Vomiting  . Penicillin V     Other reaction(s): Dizziness (intolerance), yeast infection  . Vicodin [Hydrocodone-Acetaminophen] Nausea And  Vomiting    Labs:  Results for orders placed or performed during the hospital encounter of 04/22/21 (from the past 48 hour(s))  Urine rapid drug screen (hosp performed)     Status: Abnormal   Collection Time: 04/22/21  8:58 PM  Result Value Ref Range   Opiates NONE DETECTED NONE DETECTED   Cocaine NONE DETECTED NONE DETECTED   Benzodiazepines POSITIVE (A) NONE DETECTED   Amphetamines NONE DETECTED NONE DETECTED   Tetrahydrocannabinol POSITIVE (A) NONE DETECTED   Barbiturates NONE DETECTED NONE DETECTED    Comment: (NOTE) DRUG SCREEN FOR MEDICAL PURPOSES ONLY.  IF CONFIRMATION IS NEEDED FOR ANY PURPOSE, NOTIFY LAB WITHIN 5 DAYS.  LOWEST DETECTABLE LIMITS FOR URINE DRUG SCREEN Drug Class                     Cutoff (ng/mL) Amphetamine and metabolites    1000 Barbiturate and metabolites    200 Benzodiazepine                 200 Tricyclics and metabolites     300 Opiates and metabolites        300 Cocaine and metabolites        300 THC                            50 Performed at [facility], [address], [place], [place] [address]   Basic metabolic panel     Status: Abnormal   Collection Time: 04/24/21  1:13 PM  Result Value Ref Range   Sodium 137 135 - 145 mmol/L   Potassium 3.3 (L) 3.5 - 5.1 mmol/L   Chloride 103 98 - 111 mmol/L   CO2 25 22 - 32 mmol/L   Glucose, Bld 103 (H) 70 - 99 mg/dL    Comment: Glucose reference range applies only to samples taken after fasting for at least 8 hours.   BUN 11 6 - 20 mg/dL   Creatinine, Ser 1.91 (H) 0.44 - 1.00 mg/dL   Calcium 9.2 8.9 - 47.8 mg/dL   GFR, Estimated >29 >56 mL/min    Comment: (NOTE) Calculated using the CKD-EPI Creatinine Equation (2021)    Anion gap 9 5 - 15    Comment: Performed at [facility], [address], [place], [place] [address]    Current Facility-Administered Medications  Medication Dose Route Frequency Provider Last Rate Last Admin  . LORazepam (ATIVAN)  injection 2 mg  2 mg Intramuscular Q6H PRN Ardis Hughs, NP   2 mg at 04/24/21 1317  . OLANZapine zydis (ZYPREXA) disintegrating tablet 10 mg  10 mg Oral Q8H PRN Laveda Abbe, NP       Or  . OLANZapine (ZYPREXA) injection 10 mg  10 mg Intramuscular Q8H PRN Laveda Abbe, NP      . QUEtiapine (SEROQUEL) tablet 300 mg  300 mg Oral QHS Zadie Rhine, MD      . ziprasidone (  GEODON) injection 20 mg  20 mg Intramuscular Once PRN Ardis Hughsoleman, Zyon Rosser H, NP       Current Outpatient Medications  Medication Sig Dispense Refill  . busPIRone (BUSPAR) 5 MG tablet Take 5 mg by mouth 3 (three) times daily. LF on 11-17-20 # 90 DS 30    . ondansetron (ZOFRAN) 4 MG tablet Take 4 mg by mouth 3 (three) times daily as needed for nausea/vomiting.    . pantoprazole (PROTONIX) 40 MG tablet Take 40 tablets by mouth 2 (two) times daily.    . QUEtiapine (SEROQUEL) 300 MG tablet Take 300 mg by mouth at bedtime.    Marland Kitchen. QUEtiapine (SEROQUEL) 25 MG tablet Take 25 mg by mouth 3 (three) times daily as needed (anxiety).      Musculoskeletal: Strength & Muscle Tone: within normal limits Gait & Station: normal Patient leans: N/A  Psychiatric Specialty Exam:  Presentation  General Appearance: Bizarre; Disheveled  Eye Contact:Fleeting  Speech:Garbled  Speech Volume:Increased  Handedness:Right   Mood and Affect  Mood:Irritable  Affect:Congruent   Thought Process  Thought Processes:Disorganized  Descriptions of Associations:Loose  Orientation:None  Thought Content:Delusions  History of Schizophrenia/Schizoaffective disorder:No  Duration of Psychotic Symptoms:Less than six months  Hallucinations:No data recorded Ideas of Reference:No data recorded Suicidal Thoughts:No data recorded Homicidal Thoughts:No data recorded  Sensorium  Memory:Recent Poor; Remote Poor; Immediate Poor  Judgment:Impaired  Insight:Lacking   Executive Functions  Concentration:Poor  Attention  Span:Poor  Recall:Poor  Fund of Knowledge:Poor  Language:Poor   Psychomotor Activity  Psychomotor Activity:Psychomotor Activity: Increased; Restlessness   Assets  Assets:Housing; Physical Health; Social Support   Sleep  Sleep:Sleep: Poor   Physical Exam: Physical Exam Vitals reviewed.  HENT:     Head: Normocephalic.     Right Ear: Tympanic membrane normal.     Left Ear: Tympanic membrane normal.     Nose: Nose normal.     Mouth/Throat:     Mouth: Mucous membranes are dry.  Eyes:     Conjunctiva/sclera: Conjunctivae normal.  Cardiovascular:     Rate and Rhythm: Normal rate.  Pulmonary:     Effort: Pulmonary effort is normal.  Abdominal:     Tenderness: There is no guarding.  Musculoskeletal:        General: Normal range of motion.     Cervical back: Normal range of motion.  Skin:    General: Skin is warm and dry.     Capillary Refill: Capillary refill takes less than 2 seconds.  Neurological:     Mental Status: She is alert. She is disoriented.  Psychiatric:        Attention and Perception: She is inattentive.        Mood and Affect: Affect is angry.        Speech: Speech is rapid and pressured.        Behavior: Behavior is agitated, aggressive, hyperactive and combative.        Thought Content: Thought content is delusional.        Judgment: Judgment is impulsive.    Review of Systems  Constitutional: Negative.   HENT: Negative.   Eyes: Negative.   Respiratory: Negative.   Cardiovascular: Negative.   Gastrointestinal: Negative.   Genitourinary: Negative.   Musculoskeletal: Negative.   Skin: Negative.   Neurological: Negative.   Endo/Heme/Allergies: Negative.   Psychiatric/Behavioral: The patient is nervous/anxious (agitated ).    Blood pressure 111/87, pulse 90, temperature 99.1 F (37.3 C), temperature source Oral, resp. rate 17, SpO2 100 %. There  is no height or weight on file to calculate BMI.  Treatment Plan Summary:  Medication initiated  while assessing patient. One time Geodon 20 mg IM injection PRN. Changed Zyprexa Zytis to 10 mg PO or Zyprexa 10 mg IM. Patient was combative with staff, kicking and biting and refusing oral medications  Daily contact with patient to assess and evaluate symptoms and progress in treatment and Medication management  Disposition: Patient continues to meet inpatient criteria. Recommend psychiatric Inpatient admission when medically cleared.  Ardis Hughs, NP 04/24/2021 5:42 PM

## 2021-04-24 NOTE — ED Notes (Signed)
Pt has been agitated for last 20+ minutes. Starting to use profanity and become loud. The sitter is very calm with pt and trying to answer her questions and redirect her to stay in room.

## 2021-04-24 NOTE — ED Notes (Signed)
Pt refusing food tray stating "I can't eat this food."

## 2021-04-24 NOTE — ED Notes (Signed)
Ms Sierra Ortiz currently requesting medication for sleep states she take 300 mg Seroquel at home for her HS dose. Ms Dearman was told I would let doctor know her request .

## 2021-04-24 NOTE — BH Assessment (Addendum)
Patient continues to meet inpatient criteria. Recommend psychiatric Inpatient admission when medically cleared.  Via secure chat, followed up with night time [facility] Selena Batten, RN) and notified of patient's bed needs. Requested AC to review patient for a [facility] admission, pending [facility] capacity/availability during the night shift.

## 2021-04-24 NOTE — BH Assessment (Signed)
[facility] Assessment Progress Note  Per Vernard Gambles, NP, this pt continues to require psychiatric hospitalization at this time.  Pt presents under IVC initiated by pt's mother and upheld by EDP Kennis Carina, MD.  At 09:30 pt's bed need was discussed during bed meeting.  At 12:03 I followed up with Richelle Ito, RN, [facility] to remind her of pt's bed need.  At 16:20 Richelle Ito reports that [facility] will not have a bed for pt today.  Pt will remain in ED pending bed availability.  EDP Kristine Royal, MD and pt's nurse, Jonny Ruiz, have been notified.  Doylene Canning, [place] Behavioral Health Coordinator [phone]

## 2021-04-24 NOTE — BH Assessment (Signed)
Pending provider re-assessment. However, per patient's observed behaviors and mental state provider Vernard Gambles, NP), continues to recommend inpatient psychiatric treatment. Carolynhas provided medication recommendations to assist with psychiatric stabilization.

## 2021-04-24 NOTE — ED Notes (Signed)
Pt becoming more agitated and attempting to leave room, while being redirected back into room telling staff and security do not touch her.

## 2021-04-25 DIAGNOSIS — F312 Bipolar disorder, current episode manic severe with psychotic features: Secondary | ICD-10-CM | POA: Diagnosis not present

## 2021-04-25 LAB — URINALYSIS, ROUTINE W REFLEX MICROSCOPIC
Bilirubin Urine: NEGATIVE
Glucose, UA: NEGATIVE mg/dL
Hgb urine dipstick: NEGATIVE
Ketones, ur: 20 mg/dL — AB
Leukocytes,Ua: NEGATIVE
Nitrite: NEGATIVE
Protein, ur: NEGATIVE mg/dL
Specific Gravity, Urine: 1.015 (ref 1.005–1.030)
pH: 6 (ref 5.0–8.0)

## 2021-04-25 LAB — RESP PANEL BY RT-PCR (FLU A&B, COVID) ARPGX2
Influenza A by PCR: NEGATIVE
Influenza B by PCR: NEGATIVE
SARS Coronavirus 2 by RT PCR: NEGATIVE

## 2021-04-25 NOTE — BH Assessment (Addendum)
[facility] Assessment Progress Note  Per Vernard Gambles, NP, this involuntary pt continues to require psychiatric hospitalization at this time.  Under the direction of Nelly Rout, MD, this writer is now to seek placement for pt at facilities outside of the [facility] system, given that appropriate beds are not currently available.  The following facilities have been contacted to seek placement for this pt, with results as noted:  Beds available, information sent, decision pending: [facility] Linus Sal[facility]   At capacity: Milana Kidney, [place] Behavioral Health Coordinator [phone]

## 2021-04-25 NOTE — ED Notes (Signed)
Pt has been alert this shift but drowsy. Pt resting comfortably . Pt cooperative with care. Pt ate a small about for breakfast. Pt drank water and juice.

## 2021-04-25 NOTE — ED Notes (Signed)
Pt became agitated, upset, tearful at times, wanting to leave, trying to leave, difficult to redirect and reassure.  PRN Ativan 2 mg IM given to pt

## 2021-04-25 NOTE — ED Notes (Signed)
Patient was cooperative throughout the night.

## 2021-04-25 NOTE — ED Provider Notes (Signed)
Emergency Medicine Observation Re-evaluation Note  Sierra Ortiz is a 35 y.o. female, seen on rounds today.  Pt initially presented to the ED for complaints of Altered Mental Status Currently, the patient is resting.  Pt complains medications are making her to sleepy   Physical Exam  BP 115/72 (BP Location: Right Arm)   Pulse (!) 122   Temp 99.5 F (37.5 C) (Oral)   Resp 18   SpO2 97%  Physical Exam General: wdwn Cardiac: RRR Lungs: clear Psych: sleepy  ED Course / MDM  EKG:EKG Interpretation  Date/Time:  Sunday Apr 22 2021 04:28:02 EDT Ventricular Rate:  101 PR Interval:  128 QRS Duration: 70 QT Interval:  337 QTC Calculation: 437 R Axis:   76 Text Interpretation: Sinus tachycardia Borderline repolarization abnormality No previous ECGs available Confirmed by Kennis Carina [phone]) on 04/22/2021 4:30:57 AM   I have reviewed the labs performed to date as well as medications administered while in observation.  Recent changes in the last 24 hours include none  Plan  Current plan is for inpt treatment Patient is under full IVC at this time.   Sierra Areas, PA-C 04/25/21 Sierra Ortiz    Sierra Barrette, MD 05/03/21 [phone]

## 2021-04-26 DIAGNOSIS — F23 Brief psychotic disorder: Secondary | ICD-10-CM | POA: Insufficient documentation

## 2021-04-26 DIAGNOSIS — F312 Bipolar disorder, current episode manic severe with psychotic features: Secondary | ICD-10-CM | POA: Diagnosis not present

## 2021-04-26 MED ORDER — NICOTINE 7 MG/24HR TD PT24
7.0000 mg | MEDICATED_PATCH | Freq: Every day | TRANSDERMAL | Status: DC
  Administered 2021-04-26 – 2021-04-27 (×2): 7 mg via TRANSDERMAL
  Filled 2021-04-26 (×2): qty 1

## 2021-04-26 MED ORDER — DIPHENHYDRAMINE HCL 25 MG PO CAPS: 50.0000 mg | ORAL_CAPSULE | Freq: Every evening | ORAL | Status: DC | PRN

## 2021-04-26 MED ORDER — LIP MEDEX EX OINT
TOPICAL_OINTMENT | Freq: Once | CUTANEOUS | Status: AC
  Administered 2021-04-26: 1 via TOPICAL
  Filled 2021-04-26: qty 7

## 2021-04-26 NOTE — Progress Notes (Signed)
04/26/2021  1050  Patient has been calm and resting all morning. Patient has been up to bathroom on her own.

## 2021-04-26 NOTE — ED Notes (Signed)
Patient is currently sleeping. Patient has been very agitated and restless. RN is holding mediation and vital signs until patient wakes up.

## 2021-04-26 NOTE — ED Provider Notes (Signed)
Emergency Medicine Observation Re-evaluation Note  Sierra Ortiz is a 35 y.o. female, seen on rounds today.  Pt initially presented to the ED for complaints of Altered Mental Status Currently, the patient is Psych admission.  Physical Exam  BP 90/73 (BP Location: Right Arm)   Pulse 97   Temp (!) 97.5 F (36.4 C) (Oral)   Resp 16   SpO2 100%  Physical Exam General: No distress Lungs: Resp even and unlabored Psych: Sleeping soundly  ED Course / MDM  EKG:EKG Interpretation  Date/Time:  Sunday Apr 22 2021 04:28:02 EDT Ventricular Rate:  101 PR Interval:  128 QRS Duration: 70 QT Interval:  337 QTC Calculation: 437 R Axis:   76 Text Interpretation: Sinus tachycardia Borderline repolarization abnormality No previous ECGs available Confirmed by Kennis Carina [phone]) on 04/22/2021 4:30:57 AM   I have reviewed the labs performed to date as well as medications administered while in observation.  Recent changes in the last 24 hours include none.  Plan  Current plan is for Psych admission. Patient is under full IVC at this time.   Pollyann Savoy, MD 04/26/21 [phone]

## 2021-04-26 NOTE — ED Notes (Signed)
Pt mother is visiting.

## 2021-04-26 NOTE — ED Notes (Signed)
Patient getting agitated. Pt wants to be d/c home. Pt mom says she needs to schedule an appt with [facility] [phone].

## 2021-04-26 NOTE — ED Notes (Signed)
Dinner tray given

## 2021-04-26 NOTE — ED Notes (Signed)
Pt refused breakfast tray.  

## 2021-04-26 NOTE — Consult Note (Signed)
[facility]   Reason for Consult: Psychiatric evaluation related to aggressive behaviors.  Referring Physician:  Dr. Susy Frizzleharles Sheldon Patient Identification: Sierra Ortiz MRN:  [id number] Principal Diagnosis: Bipolar affective disorder, current episode manic with psychotic symptoms (HCC) Diagnosis:  Principal Problem:   Bipolar affective disorder, current episode manic with psychotic symptoms (HCC)   Total Time spent with patient: 20 minutes  Subjective:   Sierra Ortiz is a 35 y.o. female patient admitted by IVC on 04/23/2021 completed by patients mother.   Sierra Ortiz, 35 y.o., female patient seen face to face by this provider, consulted with Dr. Lucianne MussKumar; and chart reviewed on 04/26/21.     HPI:   During evaluation Sierra Ortiz is laying in bed in no acute distress.  She is alert, oriented x 4, anxious and cooperative. She reports her mood is depressed with congruent affect.Patient is very guarded and refuses to answer some questions. Gets irritable with questions and tearful when talking about missing Mothers day with her daughter. She does not appear to be responding to internal/external stimuli. Patient denies suicidal/self-harm/homicidal ideation and psychosis. Patient denies allegations that she was trying to jump off of a bridge. Denies she was tearing up her apartment and furniture.  Patient denies visual hallucinations.  Patient was reluctant to answer if she had auditory hallucinations.  States, "God talks to me".  When asked if she would elaborate patient stated, "this to mine and [employer] business".  Patient is experiencing paranoia when discussing her apartment. Patient states when she is in her apartment at home she hears noises that make her feel paranoid. She would not answer if it was noises or voices.  States she does not use computers or electronics she keeps them unplugged.  States, "I am done with all of that". Patient stated repetitively that  she wanted to go home.   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 States she lives in an apartment with her 231 year old daughter.  Reports that she had stopped her medications because they were not working. Reports she did have an outpatient therapist in place but she hated them.   Patient is more alert and cooperative on this assessment than the previous by this Clinical research associatewriter. She is less agitated and has been more cooperative with staff.   Past Psychiatric History: Mother reports Bipolar Risk to Self:  pt denies Risk to Others:  pt denies Prior Inpatient Therapy:  yes Prior Outpatient Therapy:  yes  Past Medical History:  Past Medical History:  Diagnosis Date  . Asthma   . Gastric ulcer   . IBS (irritable bowel syndrome)     Past Surgical History:  Procedure Laterality Date  . ABDOMINAL HYSTERECTOMY     partial    Family History: History reviewed. No pertinent family history. Family Psychiatric  History: unkown Social History:  Social History   Substance and Sexual Activity  Alcohol Use Yes   Comment: rarely     Social History   Substance and Sexual Activity  Drug Use No    Social History   Socioeconomic History  . Marital status: Single    Spouse name: Not on file  . Number of children: Not on file  . Years of education: Not on file  . Highest education level: Not on file  Occupational History  . Not on file  Tobacco Use  . Smoking status: Current Every Day Smoker    Packs/day: 0.20    Types: Cigarettes  .  Smokeless tobacco: Never Used  Substance and Sexual Activity  . Alcohol use: Yes    Comment: rarely  . Drug use: No  . Sexual activity: Not on file  Other Topics Concern  . Not on file  Social History Narrative  . Not on file   Social Determinants of Health   Financial Resource Strain: Not on file  Food Insecurity: Not on file  Transportation Needs: Not on file  Physical Activity: Not on file   Stress: Not on file  Social Connections: Not on file   Additional Social History:    Allergies:   Allergies  Allergen Reactions  . Aspirin Other (See Comments) and Nausea And Vomiting    "Makes me talk out of my head." Other reaction(s): Dizziness (intolerance) "Makes me talk out of my head."  . Hydrocodone-Acetaminophen Nausea And Vomiting  . Penicillin V     Other reaction(s): Dizziness (intolerance), yeast infection  . Vicodin [Hydrocodone-Acetaminophen] Nausea And Vomiting    Labs:  Results for orders placed or performed during the hospital encounter of 04/22/21 (from the past 48 hour(s))  Resp Panel by RT-PCR (Flu A&B, Covid) Nasopharyngeal Swab     Status: None   Collection Time: 04/25/21 12:25 PM   Specimen: Nasopharyngeal Swab; Nasopharyngeal(NP) swabs in vial transport medium  Result Value Ref Range   SARS Coronavirus 2 by RT PCR NEGATIVE NEGATIVE    Comment: (NOTE) SARS-CoV-2 target nucleic acids are NOT DETECTED.  The SARS-CoV-2 RNA is generally detectable in upper respiratory specimens during the acute phase of infection. The lowest concentration of SARS-CoV-2 viral copies this assay can detect is 138 copies/mL. A negative result does not preclude SARS-Cov-2 infection and should not be used as the sole basis for treatment or other patient management decisions. A negative result may occur with  improper specimen collection/handling, submission of specimen other than nasopharyngeal swab, presence of viral mutation(s) within the areas targeted by this assay, and inadequate number of viral copies(<138 copies/mL). A negative result must be combined with clinical observations, patient history, and epidemiological information. The expected result is Negative.  Fact Sheet for Patients:  BloggerCourse.com  Fact Sheet for Healthcare Providers:  SeriousBroker.it  This test is no t yet approved or cleared by the [place] FDA and  has been authorized for detection and/or diagnosis of SARS-CoV-2 by FDA under an Emergency Use Authorization (EUA). This EUA will remain  in effect (meaning this test can be used) for the duration of the COVID-19 declaration under Section 564(b)(1) of the Act, 21 U.S.C.section 360bbb-3(b)(1), unless the authorization is terminated  or revoked sooner.       Influenza A by PCR NEGATIVE NEGATIVE   Influenza B by PCR NEGATIVE NEGATIVE    Comment: (NOTE) The Xpert Xpress SARS-CoV-2/FLU/RSV plus assay is intended as an aid in the diagnosis of influenza from Nasopharyngeal swab specimens and should not be used as a sole basis for treatment. Nasal washings and aspirates are unacceptable for Xpert Xpress SARS-CoV-2/FLU/RSV testing.  Fact Sheet for Patients: BloggerCourse.com  Fact Sheet for Healthcare Providers: SeriousBroker.it  This test is not yet approved or cleared by the [place] FDA and has been authorized for detection and/or diagnosis of SARS-CoV-2 by FDA under an Emergency Use Authorization (EUA). This EUA will remain in effect (meaning this test can be used) for the duration of the COVID-19 declaration under Section 564(b)(1) of the Act, 21 U.S.C. section 360bbb-3(b)(1), unless the authorization is terminated or revoked.  Performed at [facility], [address], [place], [place] [address]   Urinalysis, Routine w reflex microscopic     Status: Abnormal   Collection Time: 04/25/21  3:40 PM  Result Value Ref Range   Color, Urine YELLOW YELLOW   APPearance CLEAR CLEAR   Specific Gravity, Urine 1.015 1.005 - 1.030   pH 6.0 5.0 - 8.0   Glucose, UA NEGATIVE NEGATIVE mg/dL   Hgb urine dipstick NEGATIVE NEGATIVE   Bilirubin Urine NEGATIVE NEGATIVE   Ketones, ur 20 (A) NEGATIVE mg/dL   Protein, ur NEGATIVE NEGATIVE mg/dL   Nitrite NEGATIVE NEGATIVE   Leukocytes,Ua NEGATIVE NEGATIVE     Comment: Performed at [facility], [address] [place], [place] [address]    Current Facility-Administered Medications  Medication Dose Route Frequency Provider Last Rate Last Admin  . diphenhydrAMINE (BENADRYL) capsule 50 mg  50 mg Oral QHS PRN Mancel Bale, MD      . LORazepam (ATIVAN) injection 2 mg  2 mg Intramuscular Q6H PRN Ardis Hughs, NP   2 mg at 04/25/21 1610  . nicotine (NICODERM CQ - dosed in mg/24 hr) patch 7 mg  7 mg Transdermal Daily Pollyann Savoy, MD   7 mg at 04/26/21 1219  . OLANZapine zydis (ZYPREXA) disintegrating tablet 10 mg  10 mg Oral Q8H PRN Laveda Abbe, NP   10 mg at 04/26/21 1602   Or  . OLANZapine (ZYPREXA) injection 10 mg  10 mg Intramuscular Q8H PRN Laveda Abbe, NP      . QUEtiapine (SEROQUEL) tablet 300 mg  300 mg Oral QHS Zadie Rhine, MD   300 mg at 04/25/21 2123  . ziprasidone (GEODON) injection 20 mg  20 mg Intramuscular Once PRN Ardis Hughs, NP       Current Outpatient Medications  Medication Sig Dispense Refill  . omeprazole (PRILOSEC) 20 MG capsule Take 20 mg by mouth daily.    . ondansetron (ZOFRAN) 4 MG tablet Take 4 mg by mouth 3 (three) times daily as needed for nausea/vomiting, nausea or vomiting.    . pantoprazole (PROTONIX) 40 MG tablet Take 40 tablets by mouth 2 (two) times daily.    . QUEtiapine (SEROQUEL) 25 MG tablet Take 25 mg by mouth 3 (three) times daily as needed (anxiety).    . QUEtiapine (SEROQUEL) 300 MG tablet Take 300 mg by mouth at bedtime.      Musculoskeletal: Strength & Muscle Tone: within normal limits Gait & Station: normal Patient leans: Right and N/A   Psychiatric Specialty Exam:  Presentation  General Appearance: Disheveled  Eye Contact:Minimal  Speech:Clear and Coherent; Normal Rate  Speech Volume:Normal  Handedness:Right   Mood and Affect  Mood:Depressed; Anxious; Irritable  Affect:Depressed; Congruent; Tearful   Thought Process   Thought Processes:Disorganized  Descriptions of Associations:Intact  Orientation:Full (Time, Place and Person)  Thought Content:Scattered  History of Schizophrenia/Schizoaffective disorder:No  Duration of Psychotic Symptoms:Less than six months  Hallucinations:Hallucinations: Auditory Description of Auditory Hallucinations: states God talks to her but she would not elaborate  Ideas of Reference:Paranoia  Suicidal Thoughts:Suicidal Thoughts: No  Homicidal Thoughts:Homicidal Thoughts: No   Sensorium  Memory:Immediate Poor; Recent Poor; Remote Good  Judgment:Poor  Insight:Poor   Executive Functions  Concentration:Fair  Attention Span:Fair  Recall:Fair  Fund of Knowledge:Fair  Language:Good   Psychomotor Activity  Psychomotor Activity:Psychomotor Activity: Normal   Assets  Assets:Communication Skills; Desire for Improvement; Financial Resources/Insurance; Housing; Physical Health; Resilience; Social Support   Sleep  Sleep:Sleep: Poor   Physical  Exam: Physical Exam Vitals reviewed.  Constitutional:      Appearance: Normal appearance.  HENT:     Head: Normocephalic.     Right Ear: Tympanic membrane normal.     Left Ear: Tympanic membrane normal.     Nose: Nose normal.     Mouth/Throat:     Mouth: Mucous membranes are dry.     Pharynx: Oropharynx is clear.  Eyes:     Conjunctiva/sclera: Conjunctivae normal.  Cardiovascular:     Rate and Rhythm: Normal rate.     Pulses: Normal pulses.  Pulmonary:     Effort: Pulmonary effort is normal.  Abdominal:     Tenderness: There is no guarding.  Musculoskeletal:        General: Normal range of motion.     Cervical back: Normal range of motion.  Skin:    General: Skin is warm and dry.     Capillary Refill: Capillary refill takes less than 2 seconds.  Neurological:     Mental Status: She is alert and oriented to person, place, and time.  Psychiatric:        Attention and Perception: She perceives  auditory hallucinations.        Mood and Affect: Mood is anxious. Affect is angry and tearful.        Speech: Speech normal.        Behavior: Behavior is agitated. Behavior is cooperative.        Thought Content: Thought content is paranoid. Thought content does not include homicidal or suicidal ideation.        Cognition and Memory: Cognition normal.        Judgment: Judgment is impulsive.    Review of Systems  Constitutional: Negative.   HENT: Negative.   Eyes: Negative.   Respiratory: Negative.   Cardiovascular: Negative.   Gastrointestinal: Negative.   Genitourinary: Negative.   Musculoskeletal: Negative.   Skin: Negative.   Neurological: Negative.   Endo/Heme/Allergies: Negative.   Psychiatric/Behavioral: Positive for depression and hallucinations. The patient is nervous/anxious.    Blood pressure 106/67, pulse (!) 120, temperature 98 F (36.7 C), temperature source Oral, resp. rate (!) 22, SpO2 91 %. There is no height or weight on file to calculate BMI.  Treatment Plan Summary: Daily contact with patient to assess and evaluate symptoms and progress in treatment and Medication management    Disposition: Patient continues to meet psychiatric Inpatient admission when medically cleared.  Ardis Hughs, NP 04/26/2021 9:13 PM

## 2021-04-26 NOTE — BH Assessment (Addendum)
[facility] Assessment Progress Note  Per Vernard Gambles, NP, this involuntary pt continues to require psychiatric hospitalization at this time.  The following facilities have been contacted to seek placement for this pt, with results as noted:  Beds available, information sent, decision pending: [facility]  Declined: [facility] (due to pt acuity)  At capacity: [facility]   Doylene Canning, [place] Behavioral Health Coordinator [phone]

## 2021-04-27 DIAGNOSIS — F312 Bipolar disorder, current episode manic severe with psychotic features: Secondary | ICD-10-CM | POA: Diagnosis not present

## 2021-04-27 LAB — GROUP A STREP BY PCR: Group A Strep by PCR: NOT DETECTED

## 2021-04-27 MED ORDER — MENTHOL 3 MG MT LOZG
1.0000 | LOZENGE | OROMUCOSAL | Status: DC | PRN
  Administered 2021-04-27 (×2): 3 mg via ORAL
  Filled 2021-04-27: qty 9

## 2021-04-27 MED ORDER — LIDOCAINE VISCOUS HCL 2 % MT SOLN
15.0000 mL | Freq: Once | OROMUCOSAL | Status: AC
  Administered 2021-04-27: 15 mL via ORAL
  Filled 2021-04-27 (×2): qty 15

## 2021-04-27 MED ORDER — ALUM & MAG HYDROXIDE-SIMETH 200-200-20 MG/5ML PO SUSP
30.0000 mL | Freq: Once | ORAL | Status: AC
  Administered 2021-04-27: 30 mL via ORAL
  Filled 2021-04-27: qty 30

## 2021-04-27 NOTE — Discharge Instructions (Signed)
For your behavioral health needs you are advised to continue treatment with [facility]:       [facility]      [address]      [place], [place] [address]      [phone]  

## 2021-04-27 NOTE — BH Assessment (Addendum)
[facility] Assessment Progress Note  Per Maxie Barb, NP, this pt does not require psychiatric hospitalization at this time.  Pt presents under IVC initiated by pt's mother and upheld by EDP Kennis Carina, MD which has been rescinded by Nelly Rout, MD.  Pt is psychiatrically cleared.  Discharge instructions advise pt to continue treatment at [facility].  EDP Kristine Royal, MD and pt's nurse, Waynetta Sandy, have been notified.  Doylene Canning, MA Triage Specialist [phone]

## 2021-04-27 NOTE — ED Notes (Signed)
Pt DC d off unit to home per provider. Pt alert, calm, cooperative, no s/s of distress. DC information given and reviewed with pt. Belongings given to pt. Pt ambulatory off unit, escorted by NT. Pt transported by family.

## 2021-04-27 NOTE — ED Provider Notes (Signed)
Emergency Medicine Observation Re-evaluation Note  Sierra Ortiz is a 35 y.o. female, seen on rounds today.  Pt initially presented to the ED for complaints of Altered Mental Status Currently, the patient is asleep.  Physical Exam  BP 92/64 (BP Location: Right Arm)   Pulse 97   Temp 98.4 F (36.9 C) (Oral)   Resp 20   SpO2 100%  Physical Exam General: NAD, asleep   ED Course / MDM  EKG:EKG Interpretation  Date/Time:  Sunday Apr 22 2021 04:28:02 EDT Ventricular Rate:  101 PR Interval:  128 QRS Duration: 70 QT Interval:  337 QTC Calculation: 437 R Axis:   76 Text Interpretation: Sinus tachycardia Borderline repolarization abnormality No previous ECGs available Confirmed by Kennis Carina [phone]) on 04/22/2021 4:30:57 AM   I have reviewed the labs performed to date as well as medications administered while in observation.  Recent changes in the last 24 hours include no acute events reported.  Plan  Current plan is for inpatient placement. Patient is under full IVC at this time.   Wynetta Fines, MD 04/27/21 [phone]

## 2021-04-27 NOTE — BH Assessment (Signed)
Spoke with patient's mother this date Sterling Big [phone] who is requesting patient be discharged this date. Mother was informed that patient has been accepted to a inpatient facility if she felt further treatment was necessary although patient is requesting to be discharged also. Mother reports that she feels ongoing treatment might actually worsen patient's condition and states she would "rather she be home with her." Mother will assist with transportation later this date.

## 2021-04-27 NOTE — Consult Note (Signed)
[facility] Face-to-Face Psychiatry Consult   Reason for Consult:  IVC Referring Physician:  Kennis Carina, MD Patient Identification: Sierra Ortiz MRN:  [id number] Principal Diagnosis: Bipolar affective disorder, current episode manic with psychotic symptoms (HCC) Diagnosis:  Principal Problem:   Bipolar affective disorder, current episode manic with psychotic symptoms (HCC)   Total Time spent with patient: 30 minutes  Subjective:   Sierra Ortiz is a 35 y.o. female patient admitted via IVC by her mother for mania and psychotic symptoms.  Patient presents lying in bed; calm and cooperative, becomes irritable during assessment stating, "there is nothing wrong with me. I am not psychotic. I am not crazy. I am tired and ready to go home. I do not belong here. You can call my mama, I am not crazy. I've been taking my medicine and doing everything y'all tell me. I need to go home. I don't want to hurt myself. I don't want to hurt anyone else. I'm not psychotic or on drugs. I am fully aware".   Collateral: Sterling Big [phone] (TTS to call) Per TTS note: 04/27/21 1242 "Spoke with patient's mother this date Sterling Big [phone] who is requesting patient be discharged this date. Mother was informed that patient has been accepted to a inpatient facility if she felt further treatment was necessary although patient is requesting to be discharged also. Mother reports that she feels ongoing treatment might actually worsen patient's condition and states she would "rather she be home with her." Mother will assist with transportation later this date."  HPI:   Sierra Ortiz is a 35 y.o. year-old female with a history of bipolar disorder presenting to the [facility] by [facility] via IVC with chief complaint of IVC. Abnormal behavior worsening over the past week. IVC obtained by family. Brought in by police. Combative, screaming. Seems to be talking to herself, not making sense.  Past Psychiatric History:    -bipolar affective disorder, current episode manic with   psychotic symptoms  Risk to Self:  pt denies Risk to Others:  pt denies Prior Inpatient Therapy:  no Prior Outpatient Therapy:  yes  Past Medical History:  Past Medical History:  Diagnosis Date  . Asthma   . Gastric ulcer   . IBS (irritable bowel syndrome)     Past Surgical History:  Procedure Laterality Date  . ABDOMINAL HYSTERECTOMY     partial    Family History: History reviewed. No pertinent family history. Family Psychiatric  History: not noted Social History:  Social History   Substance and Sexual Activity  Alcohol Use Yes   Comment: rarely     Social History   Substance and Sexual Activity  Drug Use No    Social History   Socioeconomic History  . Marital status: Single    Spouse name: Not on file  . Number of children: Not on file  . Years of education: Not on file  . Highest education level: Not on file  Occupational History  . Not on file  Tobacco Use  . Smoking status: Current Every Day Smoker    Packs/day: 0.20    Types: Cigarettes  . Smokeless tobacco: Never Used  Substance and Sexual Activity  . Alcohol use: Yes    Comment: rarely  . Drug use: No  . Sexual activity: Not on file  Other Topics Concern  . Not on file  Social History Narrative  . Not on file   Social Determinants of Health   Financial Resource Strain: Not on file  Food Insecurity:  Not on file  Transportation Needs: Not on file  Physical Activity: Not on file  Stress: Not on file  Social Connections: Not on file   Additional Social History:    Allergies:   Allergies  Allergen Reactions  . Aspirin Other (See Comments) and Nausea And Vomiting    "Makes me talk out of my head." Other reaction(s): Dizziness (intolerance) "Makes me talk out of my head."  . Hydrocodone-Acetaminophen Nausea And Vomiting  . Penicillin V     Other reaction(s): Dizziness (intolerance), yeast infection  . Vicodin  [Hydrocodone-Acetaminophen] Nausea And Vomiting    Labs:  Results for orders placed or performed during the hospital encounter of 04/22/21 (from the past 48 hour(s))  Group A Strep by PCR     Status: None   Collection Time: 04/27/21  5:06 AM   Specimen: Throat; Sterile Swab  Result Value Ref Range   Group A Strep by PCR NOT DETECTED NOT DETECTED    Comment: Performed at [facility], [address], [place], [place] [address]    No current facility-administered medications for this encounter.   Current Outpatient Medications  Medication Sig Dispense Refill  . omeprazole (PRILOSEC) 20 MG capsule Take 20 mg by mouth daily.    . ondansetron (ZOFRAN) 4 MG tablet Take 4 mg by mouth 3 (three) times daily as needed for nausea/vomiting, nausea or vomiting.    . pantoprazole (PROTONIX) 40 MG tablet Take 40 tablets by mouth 2 (two) times daily.    . QUEtiapine (SEROQUEL) 25 MG tablet Take 25 mg by mouth 3 (three) times daily as needed (anxiety).    . QUEtiapine (SEROQUEL) 300 MG tablet Take 300 mg by mouth at bedtime.     Musculoskeletal: Strength & Muscle Tone: within normal limits Gait & Station: normal Patient leans: N/A            Psychiatric Specialty Exam:  Presentation  General Appearance: Disheveled  Eye Contact:Minimal  Speech:Clear and Coherent; Normal Rate  Speech Volume:Normal  Handedness:Right   Mood and Affect  Mood:Depressed; Anxious; Irritable  Affect:Depressed; Congruent; Tearful   Thought Process  Thought Processes:Disorganized  Descriptions of Associations:Intact  Orientation:Full (Time, Place and Person)  Thought Content:Scattered  History of Schizophrenia/Schizoaffective disorder:No  Duration of Psychotic Symptoms:Less than six months  Hallucinations:Hallucinations: Auditory Description of Auditory Hallucinations: states God talks to her but she would not elaborate  Ideas of Reference:Paranoia  Suicidal  Thoughts:Suicidal Thoughts: No  Homicidal Thoughts:Homicidal Thoughts: No   Sensorium  Memory:Immediate Poor; Recent Poor; Remote Good  Judgment:Poor  Insight:Poor   Executive Functions  Concentration:Fair  Attention Span:Fair  Recall:Fair  Fund of Knowledge:Fair  Language:Good   Psychomotor Activity  Psychomotor Activity:Psychomotor Activity: Normal   Assets  Assets:Communication Skills; Desire for Improvement; Financial Resources/Insurance; Housing; Physical Health; Resilience; Social Support   Sleep  Sleep:Sleep: Poor   Physical Exam: Physical Exam Vitals and nursing note reviewed.  Psychiatric:        Attention and Perception: Attention and perception normal.        Mood and Affect: Affect is blunt.        Speech: Speech normal.        Behavior: Behavior is cooperative.        Thought Content: Thought content normal. Thought content is not paranoid or delusional. Thought content does not include homicidal or suicidal ideation. Thought content does not include homicidal or suicidal plan.        Cognition and Memory: Cognition and memory normal.  Judgment: Judgment normal.    Review of Systems  Psychiatric/Behavioral: Negative.  Negative for suicidal ideas.  All other systems reviewed and are negative.  Blood pressure 105/68, pulse 95, temperature 98.2 F (36.8 C), temperature source Oral, resp. rate 18, SpO2 99 %. There is no height or weight on file to calculate BMI.  Treatment Plan Summary: Plan Discharge patient home to family with outpatient resources for individual follow-up. Both patient and mom contract for safety at this time.   Disposition: No evidence of imminent risk to self or others at present.   Patient does not meet criteria for psychiatric inpatient admission. Supportive therapy provided about ongoing stressors. Discussed crisis plan, support from social network, calling 911, coming to the Emergency Department, and calling  Suicide Hotline.  Loletta Parish, NP 04/27/2021 8:09 PM

## 2021-04-27 NOTE — ED Notes (Signed)
Patient would like an order for chloraseptic lozenges or spray for sore throat

## 2021-05-12 ENCOUNTER — Ambulatory Visit (HOSPITAL_COMMUNITY)
Admission: EM | Admit: 2021-05-12 | Discharge: 2021-05-14 | Disposition: A | Discharge status: Other Acute Inpatient Hospital | Payer: 59 | Attending: Nurse Practitioner | Admitting: Nurse Practitioner

## 2021-05-12 ENCOUNTER — Other Ambulatory Visit: Payer: Self-pay

## 2021-05-12 DIAGNOSIS — F312 Bipolar disorder, current episode manic severe with psychotic features: Secondary | ICD-10-CM

## 2021-05-12 DIAGNOSIS — Z20822 Contact with and (suspected) exposure to covid-19: Secondary | ICD-10-CM | POA: Insufficient documentation

## 2021-05-12 DIAGNOSIS — Z79899 Other long term (current) drug therapy: Secondary | ICD-10-CM | POA: Insufficient documentation

## 2021-05-12 DIAGNOSIS — R451 Restlessness and agitation: Secondary | ICD-10-CM | POA: Diagnosis present

## 2021-05-12 DIAGNOSIS — F17210 Nicotine dependence, cigarettes, uncomplicated: Secondary | ICD-10-CM | POA: Insufficient documentation

## 2021-05-12 DIAGNOSIS — R45851 Suicidal ideations: Secondary | ICD-10-CM | POA: Insufficient documentation

## 2021-05-12 LAB — POCT URINE DRUG SCREEN - MANUAL ENTRY (I-SCREEN)
POC Amphetamine UR: NOT DETECTED
POC Buprenorphine (BUP): NOT DETECTED
POC Cocaine UR: NOT DETECTED
POC Marijuana UR: POSITIVE — AB
POC Methadone UR: NOT DETECTED
POC Methamphetamine UR: NOT DETECTED
POC Morphine: NOT DETECTED
POC Oxazepam (BZO): NOT DETECTED
POC Oxycodone UR: NOT DETECTED
POC Secobarbital (BAR): NOT DETECTED

## 2021-05-12 LAB — CBC WITH DIFFERENTIAL/PLATELET
Abs Immature Granulocytes: 0.06 10*3/uL (ref 0.00–0.07)
Basophils Absolute: 0 10*3/uL (ref 0.0–0.1)
Basophils Relative: 0 %
Eosinophils Absolute: 0 10*3/uL (ref 0.0–0.5)
Eosinophils Relative: 0 %
HCT: 39.7 % (ref 36.0–46.0)
Hemoglobin: 13.4 g/dL (ref 12.0–15.0)
Immature Granulocytes: 0 %
Lymphocytes Relative: 14 %
Lymphs Abs: 2 10*3/uL (ref 0.7–4.0)
MCH: 32.2 pg (ref 26.0–34.0)
MCHC: 33.8 g/dL (ref 30.0–36.0)
MCV: 95.4 fL (ref 80.0–100.0)
Monocytes Absolute: 0.8 10*3/uL (ref 0.1–1.0)
Monocytes Relative: 6 %
Neutro Abs: 11.1 10*3/uL — ABNORMAL HIGH (ref 1.7–7.7)
Neutrophils Relative %: 80 %
Platelets: 371 10*3/uL (ref 150–400)
RBC: 4.16 MIL/uL (ref 3.87–5.11)
RDW: 14.1 % (ref 11.5–15.5)
WBC: 14 10*3/uL — ABNORMAL HIGH (ref 4.0–10.5)
nRBC: 0 % (ref 0.0–0.2)

## 2021-05-12 LAB — POC SARS CORONAVIRUS 2 AG: SARSCOV2ONAVIRUS 2 AG: NEGATIVE

## 2021-05-12 LAB — POCT PREGNANCY, URINE: Preg Test, Ur: NEGATIVE

## 2021-05-12 LAB — RESP PANEL BY RT-PCR (FLU A&B, COVID) ARPGX2
Influenza A by PCR: NEGATIVE
Influenza B by PCR: NEGATIVE
SARS Coronavirus 2 by RT PCR: NEGATIVE

## 2021-05-12 MED ORDER — TRAZODONE HCL 50 MG PO TABS
50.0000 mg | ORAL_TABLET | Freq: Every evening | ORAL | Status: DC | PRN
  Administered 2021-05-12: 50 mg via ORAL
  Filled 2021-05-12: qty 1

## 2021-05-12 MED ORDER — MAGNESIUM HYDROXIDE 400 MG/5ML PO SUSP: 30.0000 mL | Freq: Every day | ORAL | Status: DC | PRN

## 2021-05-12 MED ORDER — PANTOPRAZOLE SODIUM 40 MG PO TBEC
40.0000 mg | DELAYED_RELEASE_TABLET | Freq: Every day | ORAL | Status: DC
  Administered 2021-05-12 – 2021-05-14 (×3): 40 mg via ORAL
  Filled 2021-05-12 (×3): qty 1

## 2021-05-12 MED ORDER — ALUM & MAG HYDROXIDE-SIMETH 200-200-20 MG/5ML PO SUSP: 30.0000 mL | ORAL | Status: DC | PRN

## 2021-05-12 MED ORDER — LORAZEPAM 2 MG/ML IJ SOLN
2.0000 mg | Freq: Once | INTRAMUSCULAR | Status: AC
  Administered 2021-05-12: 2 mg via INTRAMUSCULAR
  Filled 2021-05-12 (×2): qty 1

## 2021-05-12 MED ORDER — QUETIAPINE FUMARATE 300 MG PO TABS
300.0000 mg | ORAL_TABLET | Freq: Every day | ORAL | Status: DC
  Administered 2021-05-12: 300 mg via ORAL
  Filled 2021-05-12: qty 1

## 2021-05-12 MED ORDER — QUETIAPINE FUMARATE 25 MG PO TABS: 25.0000 mg | ORAL_TABLET | Freq: Three times a day (TID) | ORAL | Status: DC | PRN

## 2021-05-12 MED ORDER — DIPHENHYDRAMINE HCL 50 MG/ML IJ SOLN
50.0000 mg | Freq: Once | INTRAMUSCULAR | Status: AC
  Administered 2021-05-12: 50 mg via INTRAMUSCULAR
  Filled 2021-05-12 (×2): qty 1

## 2021-05-12 NOTE — ED Notes (Signed)
Patient demanding "something more" for sleep.  PA over to talk with patient and medication ordered.  Medication drawn up and nurses to bedside.  Patient continues to be tangential and paranoid.  Stated that she wants the medicine but doesn't feel like she can trust staff.  Patient also stated that even if she takes the right medication she can't be sure it'll work.  "And what about tomorrow?  And the next day?  And the next day?  And the next day? And the next day?"  Patient decided that while she wanted the medication, she couldn't take it.  PA aware.  Patient resting quietly when provider arrived.
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
## 2021-05-12 NOTE — ED Notes (Signed)
Patient cooperative with admission process after considerable discussion.  Patient is tangential and perseverates on individual requests distorting meanings.  Concerned about "the others".  Ambulated to unit independently.  Refused food/fluid.  Pants and jacket removed.  Patient has wet clothing.
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
## 2021-05-12 NOTE — ED Notes (Signed)
Patient sitting on bed.  Calmer, but stated she doesn't know how to talk to people.  Encouraged to rest and let the medication work.

## 2021-05-12 NOTE — BH Assessment (Signed)
[name], urgent, [id number]: 35 years old female presents voluntarily to [facility] via law enforcement and unaccompanied.  Pt reports SI; also reports taking pills in 2010.  Pt denies HI, and  AVH.  Patient denied taking prescribed medication for symptom management.  Pt signed MSE.

## 2021-05-13 DIAGNOSIS — F312 Bipolar disorder, current episode manic severe with psychotic features: Secondary | ICD-10-CM

## 2021-05-13 LAB — LIPID PANEL
Cholesterol: 210 mg/dL — ABNORMAL HIGH (ref 0–200)
HDL: 54 mg/dL (ref >40)
LDL Cholesterol: 138 mg/dL — ABNORMAL HIGH (ref 0–99)
Total CHOL/HDL Ratio: 3.9 RATIO
Triglycerides: 88 mg/dL (ref <150)
VLDL: 18 mg/dL (ref 0–40)

## 2021-05-13 LAB — COMPREHENSIVE METABOLIC PANEL
ALT: 41 U/L (ref 0–44)
AST: 21 U/L (ref 15–41)
Albumin: 4.9 g/dL (ref 3.5–5.0)
Alkaline Phosphatase: 52 U/L (ref 38–126)
Anion gap: 11 (ref 5–15)
BUN: 10 mg/dL (ref 6–20)
CO2: 27 mmol/L (ref 22–32)
Calcium: 10.4 mg/dL — ABNORMAL HIGH (ref 8.9–10.3)
Chloride: 100 mmol/L (ref 98–111)
Creatinine, Ser: 0.93 mg/dL (ref 0.44–1.00)
GFR, Estimated: >60 mL/min (ref >60)
Glucose, Bld: 101 mg/dL — ABNORMAL HIGH (ref 70–99)
Potassium: 4 mmol/L (ref 3.5–5.1)
Sodium: 138 mmol/L (ref 135–145)
Total Bilirubin: 0.8 mg/dL (ref 0.3–1.2)
Total Protein: 8.7 g/dL — ABNORMAL HIGH (ref 6.5–8.1)

## 2021-05-13 LAB — TSH: TSH: 2.672 u[IU]/mL (ref 0.350–4.500)

## 2021-05-13 MED ORDER — DIPHENHYDRAMINE HCL 50 MG/ML IJ SOLN: 50.0000 mg | Freq: Four times a day (QID) | INTRAMUSCULAR | Status: DC | PRN

## 2021-05-13 MED ORDER — ZIPRASIDONE MESYLATE 20 MG IM SOLR
20.0000 mg | Freq: Two times a day (BID) | INTRAMUSCULAR | Status: DC | PRN
  Administered 2021-05-13: 20 mg via INTRAMUSCULAR
  Filled 2021-05-13: qty 20

## 2021-05-13 MED ORDER — LORAZEPAM 2 MG/ML IJ SOLN
2.0000 mg | Freq: Four times a day (QID) | INTRAMUSCULAR | Status: DC | PRN
  Administered 2021-05-14: 2 mg via INTRAMUSCULAR
  Filled 2021-05-13: qty 1

## 2021-05-13 MED ORDER — HALOPERIDOL 5 MG PO TABS
10.0000 mg | ORAL_TABLET | Freq: Every day | ORAL | Status: DC
  Administered 2021-05-13: 10 mg via ORAL
  Filled 2021-05-13: qty 2

## 2021-05-13 MED ORDER — HALOPERIDOL 5 MG PO TABS
5.0000 mg | ORAL_TABLET | Freq: Every day | ORAL | Status: DC
  Administered 2021-05-13 – 2021-05-14 (×2): 5 mg via ORAL
  Filled 2021-05-13 (×2): qty 1

## 2021-05-13 MED ORDER — DIPHENHYDRAMINE HCL 50 MG PO CAPS
50.0000 mg | ORAL_CAPSULE | Freq: Four times a day (QID) | ORAL | Status: DC | PRN
  Administered 2021-05-13: 50 mg via ORAL
  Filled 2021-05-13: qty 1

## 2021-05-13 MED ORDER — LORAZEPAM 1 MG PO TABS
2.0000 mg | ORAL_TABLET | Freq: Four times a day (QID) | ORAL | Status: DC | PRN
  Administered 2021-05-13 – 2021-05-14 (×2): 2 mg via ORAL
  Filled 2021-05-13 (×2): qty 2

## 2021-05-13 NOTE — ED Notes (Signed)
Pt given dinner  

## 2021-05-13 NOTE — ED Notes (Signed)
Pt is getting violent,banging and hitting the door. Pt yelling at staff. Pt is not redirectable at this time. Will continue to monitor. RN and Provider notified.

## 2021-05-13 NOTE — ED Provider Notes (Addendum)
Behavioral Health Progress Note  Date and Time: 05/13/2021 2:54 PM Name: Sierra Ortiz MRN:  [id number]  Subjective: Patient is a 35 year old female with a history of bipolar disorder presenting with manic and psychotic symptoms to the [facility] voluntarily with [facility].  GPD reported she was asked to be brought in because she could not get her mind from racing. Patient presents irritated and demanding to leave.  Patient continues to report that she is suicidal.  She has poor eye contact and states "you keep sending me these damn subliminal messages.  They are just playing games with me."  Patient was asked to specify and she states that I can go and talk to the staff that are sending her those subliminal messages.  Patient reports that she has been on Seroquel for quite a while and does not remember specifically how long.  She states she does not feel that it really helps her all that much.  Patient continues stating that she feels that everybody is talking about her on the unit and that is causing her to be irritated and she has yelled at some people.  Patient is agreeable to have her medications changed at this time.  Objective: Patient presents sitting on her bed on the observation unit.  Patient appears irritated and raises her voice at times when discussing with me.  Patient's eye contact is poor and is reporting having subliminal messages sent to her from staff.  She did not report specific messages that she is receiving.  Staff report that the patient appears to be paranoid and feels that everyone on the unit is talking about her.  She gets agitated thinking that people are talking about her and raises her voice.  Consulted with Dr. Jannifer Franklin.  We will discontinue Seroquel and start patient on Haldol 5 mg p.o. every morning and 10 mg p.o. nightly.  Patient meets criteria for inpatient hospitalization.  It was reported that [facility] has no available beds and patient has been faxed out.  Patient was also demanding  to leave and was calling people to come pick her up.  Due to patient's paranoia, hallucinations of subliminal messages, and suicidal ideations the patient had been placed under IVC.  Diagnosis:  Final diagnoses:  Bipolar affective disorder, current episode manic with psychotic symptoms (HCC)    Total Time spent with patient: 30 minutes  Past Psychiatric History: Bipolar affective disorder, social anxiety, behavior concerns Past Medical History:  Past Medical History:  Diagnosis Date  . Asthma   . Gastric ulcer   . IBS (irritable bowel syndrome)     Past Surgical History:  Procedure Laterality Date  . ABDOMINAL HYSTERECTOMY     partial    Family History: No family history on file. Family Psychiatric  History: None reported Social History:  Social History   Substance and Sexual Activity  Alcohol Use Yes   Comment: rarely     Social History   Substance and Sexual Activity  Drug Use No    Social History   Socioeconomic History  . Marital status: Single    Spouse name: Not on file  . Number of children: Not on file  . Years of education: Not on file  . Highest education level: Not on file  Occupational History  . Not on file  Tobacco Use  . Smoking status: Current Every Day Smoker    Packs/day: 0.20    Types: Cigarettes  . Smokeless tobacco: Never Used  Substance and Sexual Activity  . Alcohol  use: Yes    Comment: rarely  . Drug use: No  . Sexual activity: Not on file  Other Topics Concern  . Not on file  Social History Narrative  . Not on file   Social Determinants of Health   Financial Resource Strain: Not on file  Food Insecurity: Not on file  Transportation Needs: Not on file  Physical Activity: Not on file  Stress: Not on file  Social Connections: Not on file   SDOH:  SDOH Screenings   Alcohol Screen: Not on file  Depression (ZOX0-9): Not on file  Financial Resource Strain: Not on file  Food Insecurity: Not on file  Housing: Not on file   Physical Activity: Not on file  Social Connections: Not on file  Stress: Not on file  Tobacco Use: High Risk  . Smoking Tobacco Use: Current Every Day Smoker  . Smokeless Tobacco Use: Never Used  Transportation Needs: Not on file   Additional Social History:    Pain Medications: See MAR Prescriptions: See MAR Over the Counter: See MAR History of alcohol / drug use?: Yes Longest period of sobriety (when/how long): Unknown Negative Consequences of Use:  (Pt denies) Withdrawal Symptoms:  (Pt denies) Name of Substance 1: Marijuana 1 - Age of First Use: Unknown 1 - Amount (size/oz): 3 blunts 1 - Frequency: Daily 1 - Duration: Unknown 1 - Last Use / Amount: 05/10/21 1 - Method of Aquiring: Purchase 1- Route of Use: smoking Name of Substance 2: Cigerettes 2 - Age of First Use: UTA 2 - Amount (size/oz): UTA 2 - Frequency: daily 2 - Duration: ongoing 2 - Last Use / Amount: 05/12/21 2 - Method of Aquiring: UTA 2 - Route of Substance Use: UTA                Sleep: Good  Appetite:  Fair  Current Medications:  Current Facility-Administered Medications  Medication Dose Route Frequency Provider Last Rate Last Admin  . alum & mag hydroxide-simeth (MAALOX/MYLANTA) 200-200-20 MG/5ML suspension 30 mL  30 mL Oral Q4H PRN Bobbitt, Shalon E, NP      . diphenhydrAMINE (BENADRYL) capsule 50 mg  50 mg Oral Q6H PRN Monta Police, Gerlene Burdock, FNP   50 mg at 05/13/21 1139   Or  . diphenhydrAMINE (BENADRYL) injection 50 mg  50 mg Intramuscular Q6H PRN Michele Kerlin, Feliz Beam B, FNP      . haloperidol (HALDOL) tablet 10 mg  10 mg Oral QHS Lametria Klunk B, FNP      . haloperidol (HALDOL) tablet 5 mg  5 mg Oral Daily Eesa Justiss B, FNP   5 mg at 05/13/21 1047  . LORazepam (ATIVAN) tablet 2 mg  2 mg Oral Q6H PRN Iesha Summerhill, Gerlene Burdock, FNP   2 mg at 05/13/21 1139   Or  . LORazepam (ATIVAN) injection 2 mg  2 mg Intramuscular Q6H PRN Destinae Neubecker, Feliz Beam B, FNP      . magnesium hydroxide (MILK OF MAGNESIA) suspension 30 mL   30 mL Oral Daily PRN Bobbitt, Shalon E, NP      . pantoprazole (PROTONIX) EC tablet 40 mg  40 mg Oral Daily Bobbitt, Shalon E, NP   40 mg at 05/13/21 0929  . traZODone (DESYREL) tablet 50 mg  50 mg Oral QHS PRN Bobbitt, Shalon E, NP   50 mg at 05/12/21 2219  . ziprasidone (GEODON) injection 20 mg  20 mg Intramuscular Q12H PRN Tenasia Aull, Gerlene Burdock, FNP       Current Outpatient Medications  Medication Sig Dispense Refill  . omeprazole (PRILOSEC) 20 MG capsule Take 20 mg by mouth daily.    . ondansetron (ZOFRAN) 4 MG tablet Take 4 mg by mouth 3 (three) times daily as needed for nausea/vomiting, nausea or vomiting.    . pantoprazole (PROTONIX) 40 MG tablet Take 40 tablets by mouth 2 (two) times daily.    . QUEtiapine (SEROQUEL) 25 MG tablet Take 25 mg by mouth 3 (three) times daily as needed (anxiety).    . QUEtiapine (SEROQUEL) 300 MG tablet Take 300 mg by mouth at bedtime.      Labs  Lab Results:  Admission on 05/12/2021  Component Date Value Ref Range Status  . WBC 05/12/2021 14.0* 4.0 - 10.5 K/uL Final  . RBC 05/12/2021 4.16  3.87 - 5.11 MIL/uL Final  . Hemoglobin 05/12/2021 13.4  12.0 - 15.0 g/dL Final  . HCT 16/09/9603 39.7  36.0 - 46.0 % Final  . MCV 05/12/2021 95.4  80.0 - 100.0 fL Final  . MCH 05/12/2021 32.2  26.0 - 34.0 pg Final  . MCHC 05/12/2021 33.8  30.0 - 36.0 g/dL Final  . RDW 54/08/8118 14.1  11.5 - 15.5 % Final  . Platelets 05/12/2021 371  150 - 400 K/uL Final  . nRBC 05/12/2021 0.0  0.0 - 0.2 % Final  . Neutrophils Relative % 05/12/2021 80  % Final  . Neutro Abs 05/12/2021 11.1* 1.7 - 7.7 K/uL Final  . Lymphocytes Relative 05/12/2021 14  % Final  . Lymphs Abs 05/12/2021 2.0  0.7 - 4.0 K/uL Final  . Monocytes Relative 05/12/2021 6  % Final  . Monocytes Absolute 05/12/2021 0.8  0.1 - 1.0 K/uL Final  . Eosinophils Relative 05/12/2021 0  % Final  . Eosinophils Absolute 05/12/2021 0.0  0.0 - 0.5 K/uL Final  . Basophils Relative 05/12/2021 0  % Final  . Basophils Absolute  05/12/2021 0.0  0.0 - 0.1 K/uL Final  . Immature Granulocytes 05/12/2021 0  % Final  . Abs Immature Granulocytes 05/12/2021 0.06  0.00 - 0.07 K/uL Final   Performed at [facility], [address] [place], [place] [address]  . Sodium 05/12/2021 138  135 - 145 mmol/L Final  . Potassium 05/12/2021 4.0  3.5 - 5.1 mmol/L Final  . Chloride 05/12/2021 100  98 - 111 mmol/L Final  . CO2 05/12/2021 27  22 - 32 mmol/L Final  . Glucose, Bld 05/12/2021 101* 70 - 99 mg/dL Final   Glucose reference range applies only to samples taken after fasting for at least 8 hours.  . BUN 05/12/2021 10  6 - 20 mg/dL Final  . Creatinine, Ser 05/12/2021 0.93  0.44 - 1.00 mg/dL Final  . Calcium 95/62/1308 10.4* 8.9 - 10.3 mg/dL Final  . Total Protein 05/12/2021 8.7* 6.5 - 8.1 g/dL Final  . Albumin 65/78/4696 4.9  3.5 - 5.0 g/dL Final  . AST 29/52/8413 21  15 - 41 U/L Final  . ALT 05/12/2021 41  0 - 44 U/L Final  . Alkaline Phosphatase 05/12/2021 52  38 - 126 U/L Final  . Total Bilirubin 05/12/2021 0.8  0.3 - 1.2 mg/dL Final  . GFR, Estimated 05/12/2021 >60  >60 mL/min Final   Comment: (NOTE) Calculated using the CKD-EPI Creatinine Equation (2021)   . Anion gap 05/12/2021 11  5 - 15 Final   Performed at [facility], [address], [place], [place] [address]  . POC Amphetamine UR 05/12/2021 None Detected  NONE  DETECTED (Cut Off Level 1000 ng/mL) Final  . POC Secobarbital (BAR) 05/12/2021 None Detected  NONE DETECTED (Cut Off Level 300 ng/mL) Final  . POC Buprenorphine (BUP) 05/12/2021 None Detected  NONE DETECTED (Cut Off Level 10 ng/mL) Final  . POC Oxazepam (BZO) 05/12/2021 None Detected  NONE DETECTED (Cut Off Level 300 ng/mL) Final  . POC Cocaine UR 05/12/2021 None Detected  NONE DETECTED (Cut Off Level 300 ng/mL) Final  . POC Methamphetamine UR 05/12/2021 None Detected  NONE DETECTED (Cut Off Level 1000 ng/mL) Final  . POC Morphine 05/12/2021 None Detected  NONE DETECTED (Cut Off Level 300 ng/mL)  Final  . POC Oxycodone UR 05/12/2021 None Detected  NONE DETECTED (Cut Off Level 100 ng/mL) Final  . POC Methadone UR 05/12/2021 None Detected  NONE DETECTED (Cut Off Level 300 ng/mL) Final  . POC Marijuana UR 05/12/2021 Positive* NONE DETECTED (Cut Off Level 50 ng/mL) Final  . SARS Coronavirus 2 by RT PCR 05/12/2021 NEGATIVE  NEGATIVE Final   Comment: (NOTE) SARS-CoV-2 target nucleic acids are NOT DETECTED.  The SARS-CoV-2 RNA is generally detectable in upper respiratory specimens during the acute phase of infection. The lowest concentration of SARS-CoV-2 viral copies this assay can detect is 138 copies/mL. A negative result does not preclude SARS-Cov-2 infection and should not be used as the sole basis for treatment or other patient management decisions. A negative result may occur with  improper specimen collection/handling, submission of specimen other than nasopharyngeal swab, presence of viral mutation(s) within the areas targeted by this assay, and inadequate number of viral copies(<138 copies/mL). A negative result must be combined with clinical observations, patient history, and epidemiological information. The expected result is Negative.  Fact Sheet for Patients:  BloggerCourse.comhttps://www.fda.gov/media/152166/download  Fact Sheet for Healthcare Providers:  SeriousBroker.ithttps://www.fda.gov/media/152162/download  This test is no                          t yet approved or cleared by the [place] FDA and  has been authorized for detection and/or diagnosis of SARS-CoV-2 by FDA under an Emergency Use Authorization (EUA). This EUA will remain  in effect (meaning this test can be used) for the duration of the COVID-19 declaration under Section 564(b)(1) of the Act, 21 U.S.C.section 360bbb-3(b)(1), unless the authorization is terminated  or revoked sooner.      . Influenza A by PCR 05/12/2021 NEGATIVE  NEGATIVE Final  . Influenza B by PCR 05/12/2021 NEGATIVE  NEGATIVE Final   Comment:  (NOTE) The Xpert Xpress SARS-CoV-2/FLU/RSV plus assay is intended as an aid in the diagnosis of influenza from Nasopharyngeal swab specimens and should not be used as a sole basis for treatment. Nasal washings and aspirates are unacceptable for Xpert Xpress SARS-CoV-2/FLU/RSV testing.  Fact Sheet for Patients: BloggerCourse.comhttps://www.fda.gov/media/152166/download  Fact Sheet for Healthcare Providers: SeriousBroker.ithttps://www.fda.gov/media/152162/download  This test is not yet approved or cleared by the [place] FDA and has been authorized for detection and/or diagnosis of SARS-CoV-2 by FDA under an Emergency Use Authorization (EUA). This EUA will remain in effect (meaning this test can be used) for the duration of the COVID-19 declaration under Section 564(b)(1) of the Act, 21 U.S.C. section 360bbb-3(b)(1), unless the authorization is terminated or revoked.  Performed at [facility], [address] [place], [place] [id number]   . SARSCOV2ONAVIRUS 2 AG 05/12/2021 NEGATIVE  NEGATIVE Final   Comment: (NOTE) SARS-CoV-2 antigen NOT DETECTED.   Negative results are presumptive.  Negative results do  not preclude SARS-CoV-2 infection and should not be used as the sole basis for treatment or other patient management decisions, including infection  control decisions, particularly in the presence of clinical signs and  symptoms consistent with COVID-19, or in those who have been in contact with the virus.  Negative results must be combined with clinical observations, patient history, and epidemiological information. The expected result is Negative.  Fact Sheet for Patients: https://www.jennings-kim.com/  Fact Sheet for Healthcare Providers: https://alexander-rogers.biz/  This test is not yet approved or cleared by the [place] FDA and  has been authorized for detection and/or diagnosis of SARS-CoV-2 by FDA under an Emergency Use Authorization (EUA).  This EUA  will remain in effect (meaning this test can be used) for the duration of  the COV                          ID-19 declaration under Section 564(b)(1) of the Act, 21 U.S.C. section 360bbb-3(b)(1), unless the authorization is terminated or revoked sooner.    . Preg Test, Ur 05/12/2021 NEGATIVE  NEGATIVE Final   Comment:        THE SENSITIVITY OF THIS METHODOLOGY IS >24 mIU/mL   . Cholesterol 05/12/2021 210* 0 - 200 mg/dL Final  . Triglycerides 05/12/2021 88  <150 mg/dL Final  . HDL 16/09/9603 54  >40 mg/dL Final  . Total CHOL/HDL Ratio 05/12/2021 3.9  RATIO Final  . VLDL 05/12/2021 18  0 - 40 mg/dL Final  . LDL Cholesterol 05/12/2021 138* 0 - 99 mg/dL Final   Comment:        Total Cholesterol/HDL:CHD Risk Coronary Heart Disease Risk Table                     Men   Women  1/2 Average Risk   3.4   3.3  Average Risk       5.0   4.4  2 X Average Risk   9.6   7.1  3 X Average Risk  23.4   11.0        Use the calculated Patient Ratio above and the CHD Risk Table to determine the patient's CHD Risk.        ATP III CLASSIFICATION (LDL):  <100     mg/dL   Optimal  540-981  mg/dL   Near or Above                    Optimal  130-159  mg/dL   Borderline  191-478  mg/dL   High  >295     mg/dL   Very High Performed at [facility], [address] [place], [place] [address]   . TSH 05/12/2021 2.672  0.350 - 4.500 uIU/mL Final   Comment: Performed by a 3rd Generation assay with a functional sensitivity of <=0.01 uIU/mL. Performed at [facility], [address], [place], [place] [address]   Admission on 04/22/2021, Discharged on 04/27/2021  Component Date Value Ref Range Status  . SARS Coronavirus 2 by RT PCR 04/22/2021 NEGATIVE  NEGATIVE Final   Comment: (NOTE) SARS-CoV-2 target nucleic acids are NOT DETECTED.  The SARS-CoV-2 RNA is generally detectable in upper respiratory specimens during the acute phase of infection. The lowest concentration of SARS-CoV-2 viral  copies this assay can detect is 138 copies/mL. A negative result does not preclude SARS-Cov-2 infection and should not be used as the sole basis for treatment  or other patient management decisions. A negative result may occur with  improper specimen collection/handling, submission of specimen other than nasopharyngeal swab, presence of viral mutation(s) within the areas targeted by this assay, and inadequate number of viral copies(<138 copies/mL). A negative result must be combined with clinical observations, patient history, and epidemiological information. The expected result is Negative.  Fact Sheet for Patients:  BloggerCourse.com  Fact Sheet for Healthcare Providers:  SeriousBroker.it  This test is no                          t yet approved or cleared by the [place] FDA and  has been authorized for detection and/or diagnosis of SARS-CoV-2 by FDA under an Emergency Use Authorization (EUA). This EUA will remain  in effect (meaning this test can be used) for the duration of the COVID-19 declaration under Section 564(b)(1) of the Act, 21 U.S.C.section 360bbb-3(b)(1), unless the authorization is terminated  or revoked sooner.      . Influenza A by PCR 04/22/2021 NEGATIVE  NEGATIVE Final  . Influenza B by PCR 04/22/2021 NEGATIVE  NEGATIVE Final   Comment: (NOTE) The Xpert Xpress SARS-CoV-2/FLU/RSV plus assay is intended as an aid in the diagnosis of influenza from Nasopharyngeal swab specimens and should not be used as a sole basis for treatment. Nasal washings and aspirates are unacceptable for Xpert Xpress SARS-CoV-2/FLU/RSV testing.  Fact Sheet for Patients: BloggerCourse.com  Fact Sheet for Healthcare Providers: SeriousBroker.it  This test is not yet approved or cleared by the [place] FDA and has been authorized for detection and/or diagnosis of SARS-CoV-2  by FDA under an Emergency Use Authorization (EUA). This EUA will remain in effect (meaning this test can be used) for the duration of the COVID-19 declaration under Section 564(b)(1) of the Act, 21 U.S.C. section 360bbb-3(b)(1), unless the authorization is terminated or revoked.  Performed at [facility], [address], [place], [place] [address]   . Sodium 04/22/2021 138  135 - 145 mmol/L Final  . Potassium 04/22/2021 3.6  3.5 - 5.1 mmol/L Final  . Chloride 04/22/2021 106  98 - 111 mmol/L Final  . CO2 04/22/2021 22  22 - 32 mmol/L Final  . Glucose, Bld 04/22/2021 99  70 - 99 mg/dL Final   Glucose reference range applies only to samples taken after fasting for at least 8 hours.  . BUN 04/22/2021 13  6 - 20 mg/dL Final  . Creatinine, Ser 04/22/2021 0.82  0.44 - 1.00 mg/dL Final  . Calcium 35/32/9924 9.4  8.9 - 10.3 mg/dL Final  . Total Protein 04/22/2021 8.0  6.5 - 8.1 g/dL Final  . Albumin 26/83/4196 4.5  3.5 - 5.0 g/dL Final  . AST 22/29/7989 19  15 - 41 U/L Final  . ALT 04/22/2021 14  0 - 44 U/L Final  . Alkaline Phosphatase 04/22/2021 37* 38 - 126 U/L Final  . Total Bilirubin 04/22/2021 0.9  0.3 - 1.2 mg/dL Final  . GFR, Estimated 04/22/2021 >60  >60 mL/min Final   Comment: (NOTE) Calculated using the CKD-EPI Creatinine Equation (2021)   . Anion gap 04/22/2021 10  5 - 15 Final   Performed at [facility], [address] [place], [place] [address]  . Alcohol, Ethyl (B) 04/22/2021 <10  <10 mg/dL Final   Comment: (NOTE) Lowest detectable limit for serum alcohol is 10 mg/dL.  For medical purposes only. Performed at [facility], [address], [place], [place] [address]   . Opiates 04/22/2021 NONE DETECTED  NONE DETECTED Final  . Cocaine 04/22/2021 NONE DETECTED  NONE DETECTED Final  . Benzodiazepines 04/22/2021 POSITIVE* NONE DETECTED Final  . Amphetamines 04/22/2021 NONE DETECTED  NONE DETECTED Final  .  Tetrahydrocannabinol 04/22/2021 POSITIVE* NONE DETECTED Final  . Barbiturates 04/22/2021 NONE DETECTED  NONE DETECTED Final   Comment: (NOTE) DRUG SCREEN FOR MEDICAL PURPOSES ONLY.  IF CONFIRMATION IS NEEDED FOR ANY PURPOSE, NOTIFY LAB WITHIN 5 DAYS.  LOWEST DETECTABLE LIMITS FOR URINE DRUG SCREEN Drug Class                     Cutoff (ng/mL) Amphetamine and metabolites    1000 Barbiturate and metabolites    200 Benzodiazepine                 200 Tricyclics and metabolites     300 Opiates and metabolites        300 Cocaine and metabolites        300 THC                            50 Performed at [facility], [address], [place], [place] [address]   . WBC 04/22/2021 8.3  4.0 - 10.5 K/uL Final  . RBC 04/22/2021 3.78* 3.87 - 5.11 MIL/uL Final  . Hemoglobin 04/22/2021 12.1  12.0 - 15.0 g/dL Final  . HCT 09/81/1914 36.1  36.0 - 46.0 % Final  . MCV 04/22/2021 95.5  80.0 - 100.0 fL Final  . MCH 04/22/2021 32.0  26.0 - 34.0 pg Final  . MCHC 04/22/2021 33.5  30.0 - 36.0 g/dL Final  . RDW 78/29/5621 13.1  11.5 - 15.5 % Final  . Platelets 04/22/2021 228  150 - 400 K/uL Final  . nRBC 04/22/2021 0.0  0.0 - 0.2 % Final  . Neutrophils Relative % 04/22/2021 70  % Final  . Neutro Abs 04/22/2021 5.8  1.7 - 7.7 K/uL Final  . Lymphocytes Relative 04/22/2021 19  % Final  . Lymphs Abs 04/22/2021 1.6  0.7 - 4.0 K/uL Final  . Monocytes Relative 04/22/2021 10  % Final  . Monocytes Absolute 04/22/2021 0.8  0.1 - 1.0 K/uL Final  . Eosinophils Relative 04/22/2021 0  % Final  . Eosinophils Absolute 04/22/2021 0.0  0.0 - 0.5 K/uL Final  . Basophils Relative 04/22/2021 1  % Final  . Basophils Absolute 04/22/2021 0.0  0.0 - 0.1 K/uL Final  . Immature Granulocytes 04/22/2021 0  % Final  . Abs Immature Granulocytes 04/22/2021 0.03  0.00 - 0.07 K/uL Final   Performed at [facility], [address], [place], [place] [address]  . hCG, Beta Chain, Quant, S 04/22/2021  <1  <5 mIU/mL Final   Comment:          GEST. AGE      CONC.  (mIU/mL)   <=1 WEEK        5 - 50     2 WEEKS       50 - 500     3 WEEKS       100 - 10,000     4 WEEKS     1,000 - 30,000     5 WEEKS     3,500 - 115,000   6-8 WEEKS     12,000 - 270,000    12 WEEKS  15,000 - 220,000        FEMALE AND NON-PREGNANT FEMALE:     LESS THAN 5 mIU/mL Performed at [facility], [address], [place], [place] [address]   . Cholesterol 04/22/2021 158  0 - 200 mg/dL Final  . Triglycerides 04/22/2021 68  <150 mg/dL Final  . HDL 60/45/4098 45  >40 mg/dL Final  . Total CHOL/HDL Ratio 04/22/2021 3.5  RATIO Final  . VLDL 04/22/2021 14  0 - 40 mg/dL Final  . LDL Cholesterol 04/22/2021 99  0 - 99 mg/dL Final   Comment:        Total Cholesterol/HDL:CHD Risk Coronary Heart Disease Risk Table                     Men   Women  1/2 Average Risk   3.4   3.3  Average Risk       5.0   4.4  2 X Average Risk   9.6   7.1  3 X Average Risk  23.4   11.0        Use the calculated Patient Ratio above and the CHD Risk Table to determine the patient's CHD Risk.        ATP III CLASSIFICATION (LDL):  <100     mg/dL   Optimal  119-147  mg/dL   Near or Above                    Optimal  130-159  mg/dL   Borderline  829-562  mg/dL   High  >130     mg/dL   Very High Performed at [facility], [address] [place], [place] [address]   . Sodium 04/24/2021 137  135 - 145 mmol/L Final  . Potassium 04/24/2021 3.3* 3.5 - 5.1 mmol/L Final  . Chloride 04/24/2021 103  98 - 111 mmol/L Final  . CO2 04/24/2021 25  22 - 32 mmol/L Final  . Glucose, Bld 04/24/2021 103* 70 - 99 mg/dL Final   Glucose reference range applies only to samples taken after fasting for at least 8 hours.  . BUN 04/24/2021 11  6 - 20 mg/dL Final  . Creatinine, Ser 04/24/2021 1.04* 0.44 - 1.00 mg/dL Final  . Calcium 46/96/2952 9.2  8.9 - 10.3 mg/dL Final  . GFR, Estimated 04/24/2021 >60  >60 mL/min Final    Comment: (NOTE) Calculated using the CKD-EPI Creatinine Equation (2021)   . Anion gap 04/24/2021 9  5 - 15 Final   Performed at [facility], [address], [place], [place] [address]  . SARS Coronavirus 2 by RT PCR 04/25/2021 NEGATIVE  NEGATIVE Final   Comment: (NOTE) SARS-CoV-2 target nucleic acids are NOT DETECTED.  The SARS-CoV-2 RNA is generally detectable in upper respiratory specimens during the acute phase of infection. The lowest concentration of SARS-CoV-2 viral copies this assay can detect is 138 copies/mL. A negative result does not preclude SARS-Cov-2 infection and should not be used as the sole basis for treatment or other patient management decisions. A negative result may occur with  improper specimen collection/handling, submission of specimen other than nasopharyngeal swab, presence of viral mutation(s) within the areas targeted by this assay, and inadequate number of viral copies(<138 copies/mL). A negative result must be combined with clinical observations, patient history, and epidemiological information. The expected result is Negative.  Fact Sheet for Patients:  BloggerCourse.com  Fact Sheet for Healthcare Providers:  SeriousBroker.it  This  test is no                          t yet approved or cleared by the [place] and  has been authorized for detection and/or diagnosis of SARS-CoV-2 by FDA under an Emergency Use Authorization (EUA). This EUA will remain  in effect (meaning this test can be used) for the duration of the COVID-19 declaration under Section 564(b)(1) of the Act, 21 U.S.C.section 360bbb-3(b)(1), unless the authorization is terminated  or revoked sooner.      . Influenza A by PCR 04/25/2021 NEGATIVE  NEGATIVE Final  . Influenza B by PCR 04/25/2021 NEGATIVE  NEGATIVE Final   Comment: (NOTE) The Xpert Xpress SARS-CoV-2/FLU/RSV plus assay is intended as an aid in  the diagnosis of influenza from Nasopharyngeal swab specimens and should not be used as a sole basis for treatment. Nasal washings and aspirates are unacceptable for Xpert Xpress SARS-CoV-2/FLU/RSV testing.  Fact Sheet for Patients: BloggerCourse.com  Fact Sheet for Healthcare Providers: SeriousBroker.it  This test is not yet approved or cleared by the [place] FDA and has been authorized for detection and/or diagnosis of SARS-CoV-2 by FDA under an Emergency Use Authorization (EUA). This EUA will remain in effect (meaning this test can be used) for the duration of the COVID-19 declaration under Section 564(b)(1) of the Act, 21 U.S.C. section 360bbb-3(b)(1), unless the authorization is terminated or revoked.  Performed at [facility], [address] [place], [place] [address]   . Color, Urine 04/25/2021 YELLOW  YELLOW Final  . APPearance 04/25/2021 CLEAR  CLEAR Final  . Specific Gravity, Urine 04/25/2021 1.015  1.005 - 1.030 Final  . pH 04/25/2021 6.0  5.0 - 8.0 Final  . Glucose, UA 04/25/2021 NEGATIVE  NEGATIVE mg/dL Final  . Hgb urine dipstick 04/25/2021 NEGATIVE  NEGATIVE Final  . Bilirubin Urine 04/25/2021 NEGATIVE  NEGATIVE Final  . Ketones, ur 04/25/2021 20* NEGATIVE mg/dL Final  . Protein, ur 24/26/8341 NEGATIVE  NEGATIVE mg/dL Final  . Nitrite 96/22/2979 NEGATIVE  NEGATIVE Final  . Glori Luis 04/25/2021 NEGATIVE  NEGATIVE Final   Performed at [facility], [address] [place], [place] [address]  . Group A Strep by PCR 04/27/2021 NOT DETECTED  NOT DETECTED Final   Performed at [facility], [address] [place], [place] [address]  Admission on 03/28/2021, Discharged on 03/28/2021  Component Date Value Ref Range Status  . Sodium 03/28/2021 138  135 - 145 mmol/L Final  . Potassium 03/28/2021 3.8  3.5 - 5.1 mmol/L Final  . Chloride 03/28/2021 104  98 -  111 mmol/L Final  . CO2 03/28/2021 26  22 - 32 mmol/L Final  . Glucose, Bld 03/28/2021 87  70 - 99 mg/dL Final   Glucose reference range applies only to samples taken after fasting for at least 8 hours.  . BUN 03/28/2021 7  6 - 20 mg/dL Final  . Creatinine, Ser 03/28/2021 0.75  0.44 - 1.00 mg/dL Final  . Calcium 07/29/4817 9.4  8.9 - 10.3 mg/dL Final  . Total Protein 03/28/2021 8.2* 6.5 - 8.1 g/dL Final  . Albumin 56/31/4970 4.7  3.5 - 5.0 g/dL Final  . AST 26/37/8588 18  15 - 41 U/L Final  . ALT 03/28/2021 13  0 - 44 U/L Final  . Alkaline Phosphatase 03/28/2021 37* 38 - 126 U/L Final  . Total Bilirubin 03/28/2021 0.8  0.3 - 1.2 mg/dL Final  .  GFR, Estimated 03/28/2021 >60  >60 mL/min Final   Comment: (NOTE) Calculated using the CKD-EPI Creatinine Equation (2021)   . Anion gap 03/28/2021 8  5 - 15 Final   Performed at [facility], [address] [place], [place] [address]  . WBC 03/28/2021 5.5  4.0 - 10.5 K/uL Final  . RBC 03/28/2021 3.95  3.87 - 5.11 MIL/uL Final  . Hemoglobin 03/28/2021 13.0  12.0 - 15.0 g/dL Final  . HCT 60/45/4098 38.6  36.0 - 46.0 % Final  . MCV 03/28/2021 97.7  80.0 - 100.0 fL Final  . MCH 03/28/2021 32.9  26.0 - 34.0 pg Final  . MCHC 03/28/2021 33.7  30.0 - 36.0 g/dL Final  . RDW 11/91/4782 12.9  11.5 - 15.5 % Final  . Platelets 03/28/2021 212  150 - 400 K/uL Final  . nRBC 03/28/2021 0.0  0.0 - 0.2 % Final  . Neutrophils Relative % 03/28/2021 37  % Final  . Neutro Abs 03/28/2021 2.0  1.7 - 7.7 K/uL Final  . Lymphocytes Relative 03/28/2021 51  % Final  . Lymphs Abs 03/28/2021 2.8  0.7 - 4.0 K/uL Final  . Monocytes Relative 03/28/2021 10  % Final  . Monocytes Absolute 03/28/2021 0.5  0.1 - 1.0 K/uL Final  . Eosinophils Relative 03/28/2021 1  % Final  . Eosinophils Absolute 03/28/2021 0.0  0.0 - 0.5 K/uL Final  . Basophils Relative 03/28/2021 1  % Final  . Basophils Absolute 03/28/2021 0.1  0.0 - 0.1 K/uL Final  . Immature Granulocytes  03/28/2021 0  % Final  . Abs Immature Granulocytes 03/28/2021 0.01  0.00 - 0.07 K/uL Final   Performed at [facility], [address] [place], [place] [address]  . Fecal Occult Bld 03/28/2021 NEGATIVE  NEGATIVE Final    Blood Alcohol level:  Lab Results  Component Value Date   ETH <10 04/22/2021    Metabolic Disorder Labs: No results found for: HGBA1C, MPG No results found for: PROLACTIN Lab Results  Component Value Date   CHOL 210 (H) 05/12/2021   TRIG 88 05/12/2021   HDL 54 05/12/2021   CHOLHDL 3.9 05/12/2021   VLDL 18 05/12/2021   LDLCALC 138 (H) 05/12/2021   LDLCALC 99 04/22/2021    Therapeutic Lab Levels: No results found for: LITHIUM No results found for: VALPROATE No components found for:  CBMZ  Physical Findings   Flowsheet Row ED from 05/12/2021 in [facility] ED from 04[facility]22/2021 in [facility] from 03/28/2021 in [facility]  C-SSRS RISK CATEGORY Low Risk No Risk No Risk       Musculoskeletal  Strength & Muscle Tone: within normal limits Gait & Station: normal Patient leans: N/A  Psychiatric Specialty Exam  Presentation  General Appearance: Disheveled  Eye Contact:Poor  Speech:Normal Rate  Speech Volume:Increased  Handedness:Right   Mood and Affect  Mood:Anxious; Irritable  Affect:Constricted   Thought Process  Thought Processes:Disorganized  Descriptions of Associations:Circumstantial  Orientation:Full (Time, Place and Person)  Thought Content:Delusions; Paranoid Ideation  Diagnosis of Schizophrenia or Schizoaffective disorder in past: No  Duration of Psychotic Symptoms: Greater than six months   Hallucinations:Hallucinations: Auditory (She denies, but reports hearing subliminal messages sent from staff)  Ideas of Reference:Paranoia; Delusions  Suicidal Thoughts:Suicidal Thoughts: Yes, Passive SI Passive Intent  and/or Plan: Without Intent; Without Plan  Homicidal Thoughts:Homicidal Thoughts: No   Sensorium  Memory:Immediate Poor; Recent Poor; Remote Poor  Judgment:Impaired  Insight:Lacking   Executive Functions  Concentration:Fair  Attention Span:Fair  Recall:Fair  Fund of Knowledge:Fair  Language:Fair   Psychomotor Activity  Psychomotor Activity:Psychomotor Activity: Normal   Assets  Assets:Desire for Improvement; Financial Resources/Insurance; Housing; Physical Health   Sleep  Sleep:Sleep: Fair Number of Hours of Sleep: -1   Nutritional Assessment (For OBS and FBC admissions only) Has the patient had a weight loss or gain of 10 pounds or more in the last 3 months?: No Has the patient had a decrease in food intake/or appetite?: Yes Does the patient have dental problems?: No Does the patient have eating habits or behaviors that may be indicators of an eating disorder including binging or inducing vomiting?: No Has the patient recently lost weight without trying?: No Has the patient been eating poorly because of a decreased appetite?: Yes Malnutrition Screening Tool Score: 1    Physical Exam  Physical Exam Vitals and nursing note reviewed.  Constitutional:      Appearance: She is well-developed.  HENT:     Head: Normocephalic.  Eyes:     Pupils: Pupils are equal, round, and reactive to light.  Cardiovascular:     Rate and Rhythm: Normal rate.  Pulmonary:     Effort: Pulmonary effort is normal.  Musculoskeletal:        General: Normal range of motion.  Neurological:     Mental Status: She is alert and oriented to person, place, and time.    Review of Systems  Constitutional: Negative.   HENT: Negative.   Eyes: Negative.   Respiratory: Negative.   Cardiovascular: Negative.   Gastrointestinal: Negative.   Genitourinary: Negative.   Musculoskeletal: Negative.   Skin: Negative.   Neurological: Negative.   Endo/Heme/Allergies: Negative.    Psychiatric/Behavioral: Positive for hallucinations.       Paranoid, delusional   Blood pressure (!) 127/94, pulse 87, temperature 98.9 F (37.2 C), temperature source Oral, resp. rate 16, SpO2 100 %. There is no height or weight on file to calculate BMI.  Treatment Plan Summary: Daily contact with patient to assess and evaluate symptoms and progress in treatment and Medication management  Patient has been accepted to [facility] tomorrow after 9 am per SW  Maryfrances Bunnell, [place] 05/13/2021 2:54 PM

## 2021-05-13 NOTE — ED Notes (Signed)
Pt given hygiene items to take a shower.  

## 2021-05-13 NOTE — Progress Notes (Signed)
Per Asher Muir, pt has been accepted to [facility] main campus. Accepting provider is Dr. Estill Cotta. Patient can arrive tomorrow after 9:00am. Number for report is [phone].   Crissie Reese, MSW, LCSW-A Phone: [phone] Disposition/TOC

## 2021-05-13 NOTE — ED Notes (Signed)
Pt offered breakfast; declined. Pt given juice.

## 2021-05-13 NOTE — Progress Notes (Signed)
Per Wenda Overland Bobbitt,NP patient meets criteria for inpatient treatment. There are no available or appropriate beds at [facility] today. CSW faxed referrals to the following facilities for review:  [facility]  [facility]  [facility] [place]  [facility]  [facility] Pollen   TTS will continue to seek bed placement.  Crissie Reese, MSW, LCSW-A, LCAS-A Phone: [phone] Disposition/TOC

## 2021-05-13 NOTE — Progress Notes (Signed)
Patient received Geodon 20 mg due to aggressive behaviors. Nursing staff will continue to monitor.

## 2021-05-13 NOTE — ED Notes (Signed)
Pt agitated and requesting to leave. Pt states "I am going to get out of here". Pt pulling on doors trying to get out. Staff redirected pt and informed her that she could not leave until she was discharged. Notified RN and NP.

## 2021-05-13 NOTE — ED Notes (Signed)
Pt resting with eyes closed. Rise and fall of chest noted. No new issues noted. Will continue to monitor for safety 

## 2021-05-13 NOTE — ED Notes (Signed)
Pt banging on door, trying to get out of door. Staff will continue to try to redirect.

## 2021-05-13 NOTE — ED Notes (Signed)
Patient resting, eyes closed.  RR even and unlabored.

## 2021-05-13 NOTE — ED Notes (Signed)
Patient sleeping.  RR even and unlabored.  Continue to monitor for safety. 

## 2021-05-13 NOTE — ED Notes (Signed)
Pt banging/pulling on the door, yelling at staff. Pt redirected.

## 2021-05-13 NOTE — ED Notes (Signed)
GIVEN DINNER  °

## 2021-05-13 NOTE — Progress Notes (Signed)
Patient banging on doors, yelling at staff, aggressive behaviors attempting to break door to the unit. RN informed provider patient to aggressive for open  Unit.

## 2021-05-13 NOTE — ED Notes (Signed)
Pt agitated, yelling at staff.    Barb, RN reports she spoke with Kyle, nursing supervisor about duty to warn. Reports he recommended speaking with provider. States she spoke with Dr Retzinger and he is recommending to wait until BHU evaluation.

## 2021-05-13 NOTE — ED Notes (Signed)
Pt taking a shower 

## 2021-05-13 NOTE — ED Notes (Signed)
Pt offered lunch; declined.

## 2021-05-13 NOTE — ED Notes (Signed)
Patient received at shift changed and observed lying in bed 4, resting calmly and in NAD on [facility] adult wing.  No needs or concerns at this time.  Nursing will continue to monitor closely.

## 2021-05-14 MED ORDER — TRAZODONE HCL 50 MG PO TABS: 50.0000 mg | ORAL_TABLET | Freq: Every evening | ORAL | Status: DC | PRN

## 2021-05-14 MED ORDER — PANTOPRAZOLE SODIUM 40 MG PO TBEC: 40.0000 mg | DELAYED_RELEASE_TABLET | Freq: Every day | ORAL | Status: DC

## 2021-05-14 MED ORDER — HALOPERIDOL 10 MG PO TABS: 10.0000 mg | ORAL_TABLET | Freq: Every day | ORAL | Status: DC

## 2021-05-14 MED ORDER — HALOPERIDOL 5 MG PO TABS: 5.0000 mg | ORAL_TABLET | Freq: Every day | ORAL | Status: DC

## 2021-05-14 MED ORDER — ACETAMINOPHEN 325 MG PO TABS
650.0000 mg | ORAL_TABLET | Freq: Four times a day (QID) | ORAL | Status: DC | PRN
  Administered 2021-05-14: 650 mg via ORAL
  Filled 2021-05-14: qty 2

## 2021-05-14 MED ORDER — NICOTINE 21 MG/24HR TD PT24
21.0000 mg | MEDICATED_PATCH | Freq: Once | TRANSDERMAL | Status: DC
  Administered 2021-05-14: 21 mg via TRANSDERMAL
  Filled 2021-05-14: qty 1

## 2021-05-14 NOTE — Discharge Instructions (Addendum)
Discharge to [facility] 

## 2021-05-14 NOTE — ED Notes (Signed)
Pt agitated discussing plans for discharge. Writer explained to pt that this Clinical research associate sent a message to the provider that pt would like to speak with her provider. Pt stated, "The provider has five minutes before I go blank". Pt is now banging on the unit door. Staff is out to speak with pt.

## 2021-05-14 NOTE — Progress Notes (Signed)
Pt agitated, banging on unit door demanding to leave. Pt was not able to redirect or de-escalate. PRN Ativan IM administered with no incident. Will continue monitor.

## 2021-05-14 NOTE — ED Notes (Signed)
[facility] contacted at [phone] for IVC transport from [facility] to [facility] today.  Message left and patient details provided.

## 2021-05-14 NOTE — ED Notes (Signed)
Patient resting calmly, comfortably and in no apparent distress.  Awoke briefly for drink of water.  Nursing will continue to monitor closely.

## 2021-05-14 NOTE — ED Notes (Signed)
Patient resting calmly, comfortably and in no apparent distress.  Medication compliant at bedtime.  Tolerated meds whole with water.  No further needs or concerns evident at this time.  Nursing will continue to monitor closely.

## 2021-05-14 NOTE — ED Notes (Signed)
Pt refused to move out from in front of the door in order to allow another Pt to leave via EMS. Security present and was able to assist with getting around this Pt Pt keeps banging on the door and pacing the observation unit. She also stands at the nurses desk constantly demanding to go home and stating that she did not sign IVC papers. Safety maintained and will continue to monitor.

## 2021-05-14 NOTE — ED Notes (Signed)
Patient awoke from sleep, first inquiring about her discharge time and location of personal belongings.  Patient then reported mild headache and requested Tylenol.  This Clinical research associate confirmed with patient that allergies do not include Tylenol as she has taken and tolerated in the past.  Patient confirms that she is allergic to the Hydrocodone portion of Vicodin which caused nausea and vomiting with previous ingestions.  Allergies updated and Provider made aware.  Nursing will continue to monitor for PRN effectiveness.

## 2021-05-14 NOTE — ED Notes (Signed)
Pt continues to be agitated. Pt is standing at unit door banging on the door. Pt walking on unit and knocked over the laundry basket and kicking trash cans. Staff took Corporate treasurer off unit and placed in nurses station.

## 2021-05-14 NOTE — ED Notes (Signed)
Called report to Daylene Katayama, Charity fundraiser, at [facility]

## 2021-05-14 NOTE — ED Notes (Signed)
Patient exhibiting increasing agitation related to discharge time/details and location of personal belongings.  PRN Ativan 2 mg oral administered.  Nursing will continue to monitor for effectiveness.

## 2021-05-14 NOTE — ED Notes (Signed)
Discharge instructions provided and Pt stated understanding. Pt alert, orient and ambulatory. Personal belongings returned to Pt from locker. Pt escorted to the sally port to go to [facility] via [place]. Safety maintained.

## 2021-05-14 NOTE — ED Notes (Signed)
Pt was given apple juice this morning.

## 2021-05-14 NOTE — ED Provider Notes (Signed)
FBC/OBS ASAP Discharge Summary  Date and Time: 05/14/2021 7:47 AM  Name: Sierra Ortiz  MRN:  [id number]   Discharge Diagnoses:  Final diagnoses:  Bipolar affective disorder, current episode manic with psychotic symptoms (HCC)    Subjective: Patient presents today irritable and agitated.  Patient has been banging on the doors and yelling and screaming.  Patient continues to be paranoid stating that the staff are talking about her.  Patient was placed under IVC and inpatient psychiatric treatment.  Stay Summary: Patient 35 year old female presented to the [facility] voluntarily with [place] [employer] with reports manic and psychotic symptoms.  Patient has a history of bipolar disorder.  Reported that she was brought in by police because she could not get her racing thoughts.  Patient presented to me irritable and demanding to leave.  Patient was reporting that there were staff on the unit sending her subliminal messages and that we are just playing games with her.  Patient becomes agitated and has been hitting on doors and in the walls.  Patient was started on medications to assist with stabilization however patient needed as needed medications to manage her agitation.  Patient was placed under IVC.  Patient remained in the continuous observation unit overnight while waiting for inpatient treatment.  Patient has been accepted to [facility] and was transported via Patent examiner today.  Patient remained irritable and agitated throughout the morning.  Total Time spent with patient: 30 minutes  Past Psychiatric History: bizarre behavior, manic symptoms Past Medical History:  Past Medical History:  Diagnosis Date  . Asthma   . Gastric ulcer   . IBS (irritable bowel syndrome)     Past Surgical History:  Procedure Laterality Date  . ABDOMINAL HYSTERECTOMY     partial    Family History: No family history on file. Family Psychiatric History: None reported Social History:  Social History   Substance  and Sexual Activity  Alcohol Use Yes   Comment: rarely     Social History   Substance and Sexual Activity  Drug Use No    Social History   Socioeconomic History  . Marital status: Single    Spouse name: Not on file  . Number of children: Not on file  . Years of education: Not on file  . Highest education level: Not on file  Occupational History  . Not on file  Tobacco Use  . Smoking status: Current Every Day Smoker    Packs/day: 0.20    Types: Cigarettes  . Smokeless tobacco: Never Used  Substance and Sexual Activity  . Alcohol use: Yes    Comment: rarely  . Drug use: No  . Sexual activity: Not on file  Other Topics Concern  . Not on file  Social History Narrative  . Not on file   Social Determinants of Health   Financial Resource Strain: Not on file  Food Insecurity: Not on file  Transportation Needs: Not on file  Physical Activity: Not on file  Stress: Not on file  Social Connections: Not on file   SDOH:  SDOH Screenings   Alcohol Screen: Not on file  Depression (CNO7-0): Not on file  Financial Resource Strain: Not on file  Food Insecurity: Not on file  Housing: Not on file  Physical Activity: Not on file  Social Connections: Not on file  Stress: Not on file  Tobacco Use: High Risk  . Smoking Tobacco Use: Current Every Day Smoker  . Smokeless Tobacco Use: Never Used  Transportation Needs:  Not on file    Has this patient used any form of tobacco in the last 30 days? (Cigarettes, Smokeless Tobacco, Cigars, and/or Pipes) A prescription for an FDA-approved tobacco cessation medication was offered at discharge and the patient refused  Current Medications:  Current Facility-Administered Medications  Medication Dose Route Frequency Provider Last Rate Last Admin  . acetaminophen (TYLENOL) tablet 650 mg  650 mg Oral Q6H PRN Jaclyn Shaggy, PA-C   650 mg at 05/14/21 0545  . alum & mag hydroxide-simeth (MAALOX/MYLANTA) 200-200-20 MG/5ML suspension 30 mL  30 mL  Oral Q4H PRN Bobbitt, Shalon E, NP      . diphenhydrAMINE (BENADRYL) capsule 50 mg  50 mg Oral Q6H PRN Copeland Lapier, Feliz Beam B, FNP   50 mg at 05/13/21 1139   Or  . diphenhydrAMINE (BENADRYL) injection 50 mg  50 mg Intramuscular Q6H PRN Robina Hamor, Feliz Beam B, FNP      . haloperidol (HALDOL) tablet 10 mg  10 mg Oral QHS Javonn Gauger B, FNP   10 mg at 05/13/21 2215  . haloperidol (HALDOL) tablet 5 mg  5 mg Oral Daily Bowen Goyal, Feliz Beam B, FNP   5 mg at 05/13/21 1047  . LORazepam (ATIVAN) tablet 2 mg  2 mg Oral Q6H PRN Mathias Bogacki, Gerlene Burdock, FNP   2 mg at 05/14/21 0600   Or  . LORazepam (ATIVAN) injection 2 mg  2 mg Intramuscular Q6H PRN Varnell Donate, Feliz Beam B, FNP      . magnesium hydroxide (MILK OF MAGNESIA) suspension 30 mL  30 mL Oral Daily PRN Bobbitt, Shalon E, NP      . pantoprazole (PROTONIX) EC tablet 40 mg  40 mg Oral Daily Bobbitt, Shalon E, NP   40 mg at 05/13/21 0929  . traZODone (DESYREL) tablet 50 mg  50 mg Oral QHS PRN Bobbitt, Shalon E, NP   50 mg at 05/12/21 2219  . ziprasidone (GEODON) injection 20 mg  20 mg Intramuscular Q12H PRN Maren Wiesen, Gerlene Burdock, FNP   20 mg at 05/13/21 1827   Current Outpatient Medications  Medication Sig Dispense Refill  . haloperidol (HALDOL) 10 MG tablet Take 1 tablet (10 mg total) by mouth at bedtime.    . haloperidol (HALDOL) 5 MG tablet Take 1 tablet (5 mg total) by mouth daily.    . pantoprazole (PROTONIX) 40 MG tablet Take 1 tablet (40 mg total) by mouth daily.    . traZODone (DESYREL) 50 MG tablet Take 1 tablet (50 mg total) by mouth at bedtime as needed for sleep.      PTA Medications: (Not in a hospital admission)   Musculoskeletal  Strength & Muscle Tone: within normal limits Gait & Station: normal Patient leans: N/A  Psychiatric Specialty Exam  Presentation  General Appearance: Disheveled  Eye Contact:Fair  Speech:Normal Rate  Speech Volume:Increased  Handedness:Right   Mood and Affect  Mood:Anxious; Irritable  Affect:Constricted   Thought Process   Thought Processes:Disorganized  Descriptions of Associations:Circumstantial  Orientation:Full (Time, Place and Person)  Thought Content:Delusions; Paranoid Ideation  Diagnosis of Schizophrenia or Schizoaffective disorder in past: No  Duration of Psychotic Symptoms: Greater than six months   Hallucinations:Hallucinations: Auditory  Ideas of Reference:Paranoia; Delusions  Suicidal Thoughts:Suicidal Thoughts: Yes, Passive SI Passive Intent and/or Plan: Without Intent; Without Plan  Homicidal Thoughts:Homicidal Thoughts: No   Sensorium  Memory:Immediate Poor; Recent Poor; Remote Poor  Judgment:Impaired  Insight:Lacking   Executive Functions  Concentration:Fair  Attention Span:Fair  Recall:Fair  Fund of Knowledge:Fair  Language:Fair   Psychomotor Activity  Psychomotor Activity:Psychomotor Activity: Normal   Assets  Assets:Desire for Improvement; Housing; Physical Health   Sleep  Sleep:Sleep: Fair   No data recorded  Physical Exam  Physical Exam Vitals and nursing note reviewed.  Constitutional:      Appearance: She is well-developed.  HENT:     Head: Normocephalic.  Cardiovascular:     Rate and Rhythm: Normal rate.  Pulmonary:     Effort: Pulmonary effort is normal.  Musculoskeletal:        General: Normal range of motion.  Neurological:     Mental Status: She is alert and oriented to person, place, and time.  Psychiatric:        Attention and Perception: She perceives auditory hallucinations.        Speech: Speech is tangential.        Behavior: Behavior is agitated.        Thought Content: Thought content is paranoid and delusional.    Review of Systems  Constitutional: Negative.   HENT: Negative.   Eyes: Negative.   Respiratory: Negative.   Cardiovascular: Negative.   Gastrointestinal: Negative.   Genitourinary: Negative.   Musculoskeletal: Negative.   Skin: Negative.   Neurological: Negative.   Endo/Heme/Allergies: Negative.     Blood pressure (!) 127/94, pulse 87, temperature 98.9 F (37.2 C), temperature source Oral, resp. rate 16, SpO2 100 %. There is no height or weight on file to calculate BMI.  Disposition: Discharge to [facility] under IVC and transported by law enforcement  Gerlene Burdock Christinamarie Tall, FNP 05/14/2021, 7:47 AM

## 2021-05-15 LAB — HEMOGLOBIN A1C
Hgb A1c MFr Bld: 5.2 % (ref 4.8–5.6)
Mean Plasma Glucose: 103 mg/dL

## 2021-08-24 IMAGING — CT CT RENAL STONE PROTOCOL
2 of 4 series · 17 of 46 positions shown, 19 images · non-contrast
Comparison: Ultrasound pelvis 09/07/2020

CLINICAL DATA: Left flank pain, kidney stone. History of gastric
ulcer, IBS, ovarian cyst, partial hysterectomy.

EXAM:
CT ABDOMEN AND PELVIS WITHOUT CONTRAST
TECHNIQUE: Multidetector CT imaging of the abdomen and pelvis was performed
following the standard protocol without IV contrast.

[Series 2: axial st · axial · 0.56mm/px · z∈[+1260,+1580]mm · 14 of 72 slices shown, 16 images]
[im 4/72  soft-tissue]
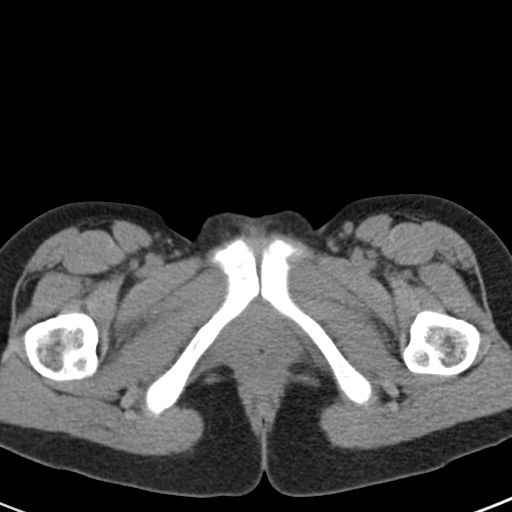
[im 4/72  bone]
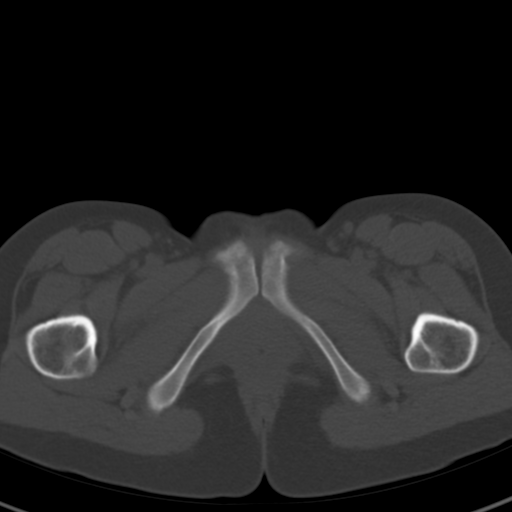
[im 8/72  soft-tissue]
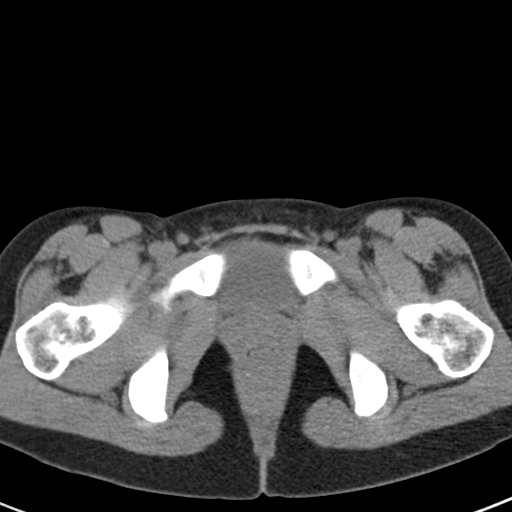
[im 16/72  soft-tissue]
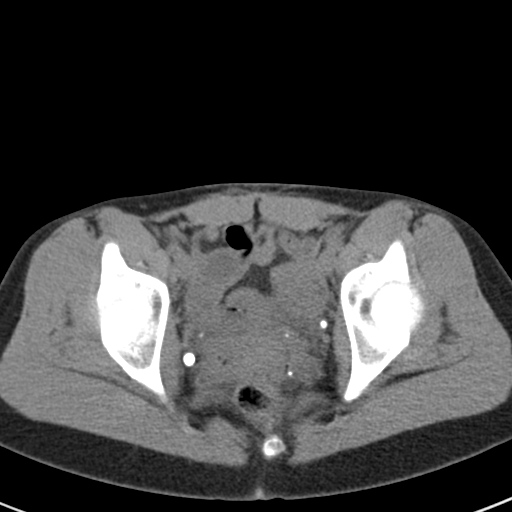
[im 20/72  soft-tissue]
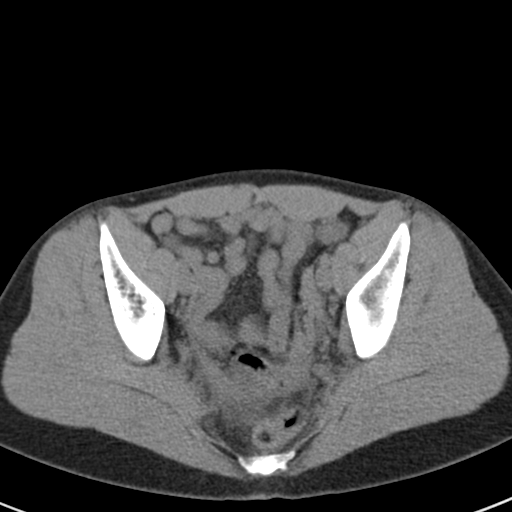
[im 24/72  soft-tissue]
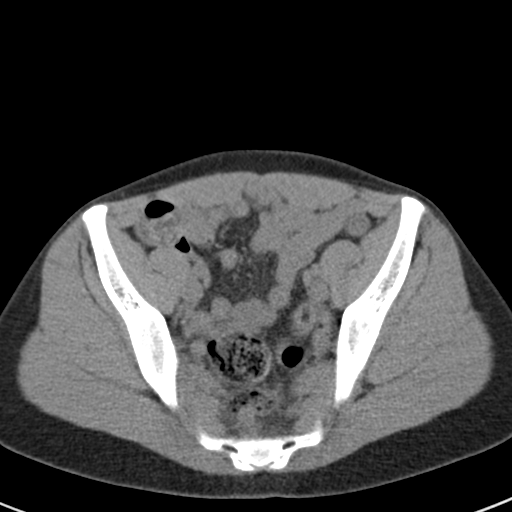
[im 28/72  soft-tissue]
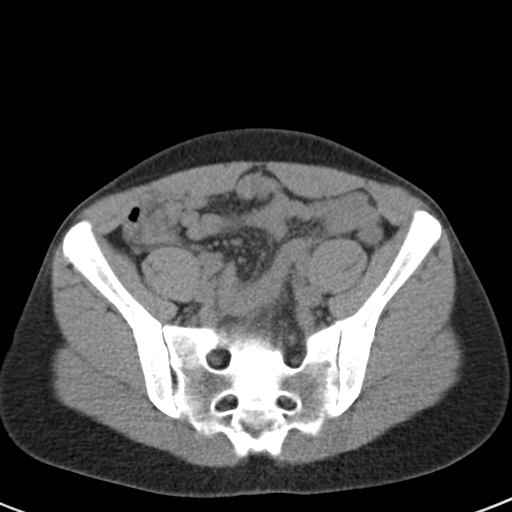
[im 32/72  soft-tissue]
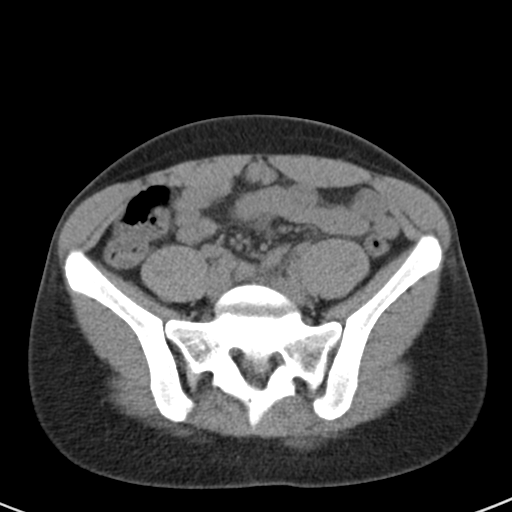
[im 40/72  soft-tissue]
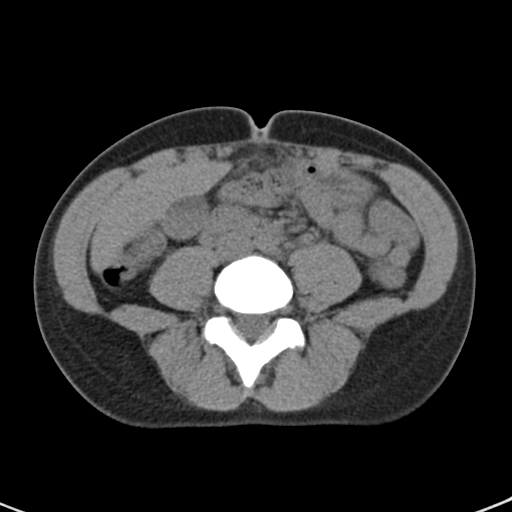
[im 44/72  soft-tissue]
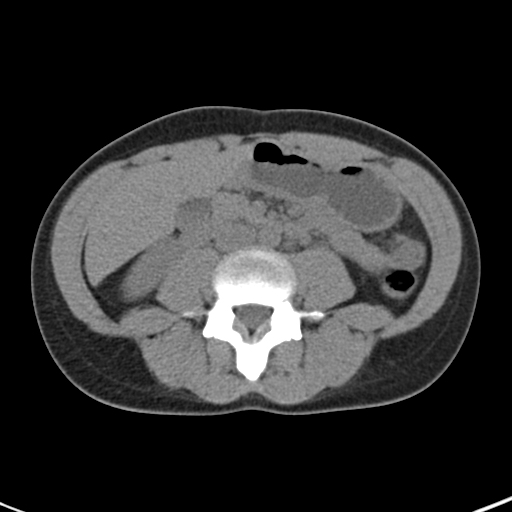
[im 44/72  bone]
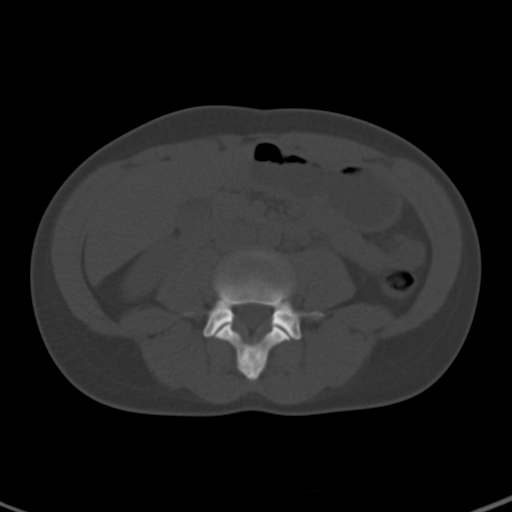
[im 48/72  soft-tissue]
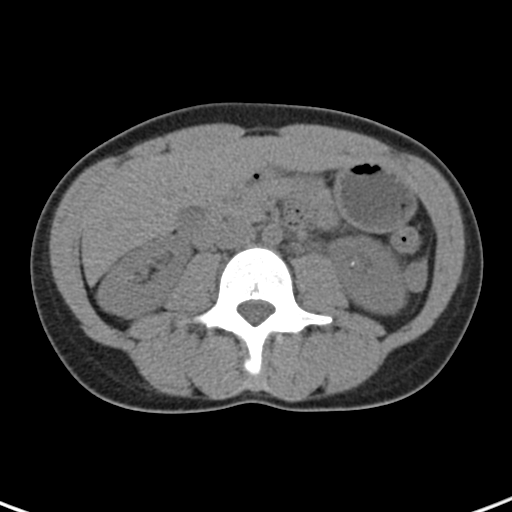
[im 52/72  soft-tissue]
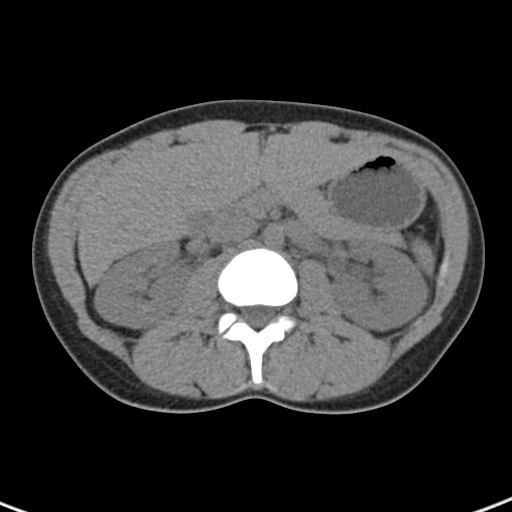
[im 56/72  soft-tissue]
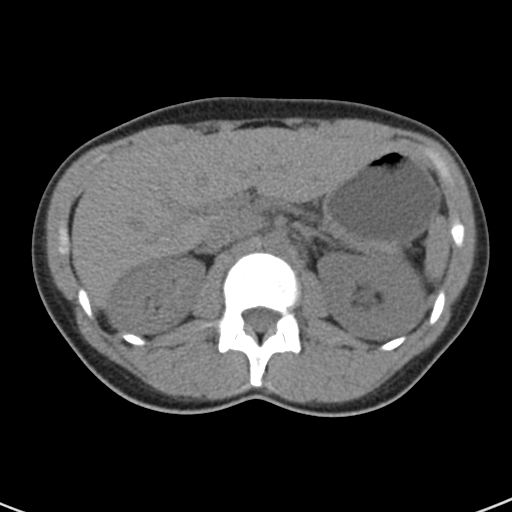
[im 64/72  soft-tissue]
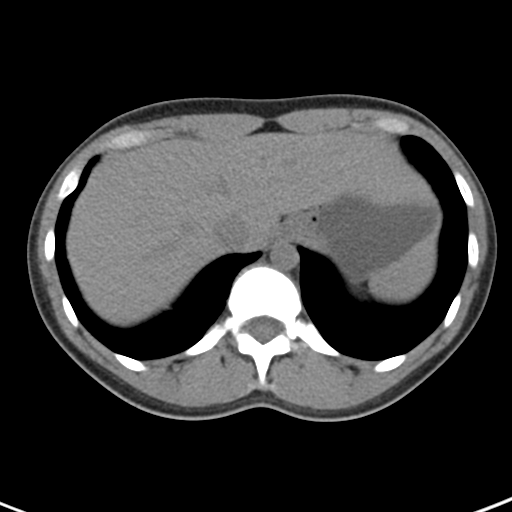
[im 68/72  soft-tissue]
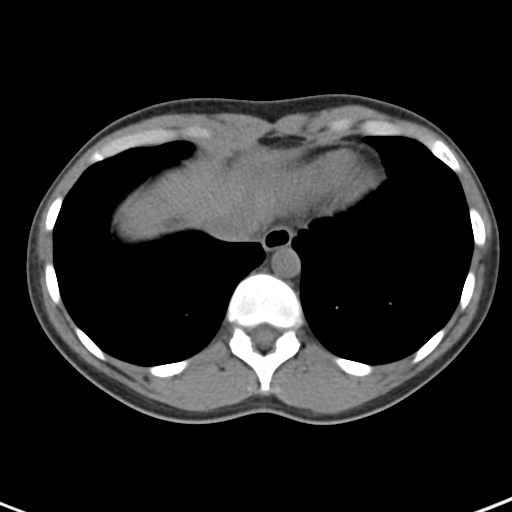

[Series 4: coronal · coronal · 0.78mm/px · 3 of 101 slices shown]
[im 34/101  soft-tissue]
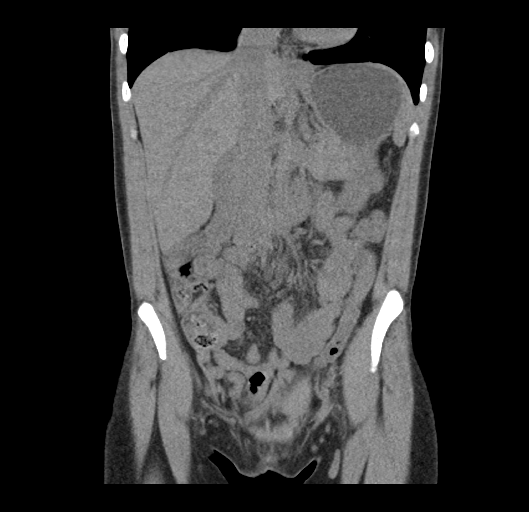
[im 45/101  soft-tissue]
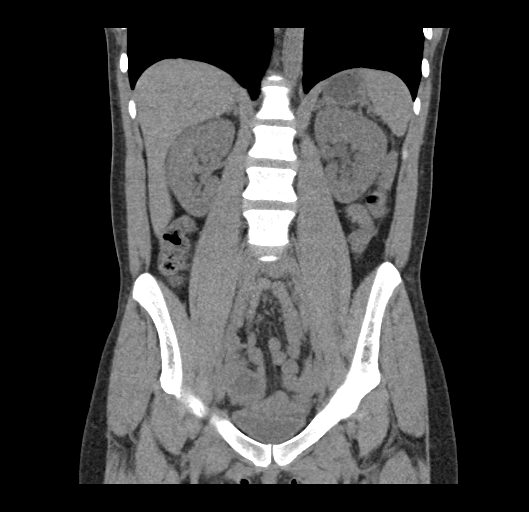
[im 56/101  soft-tissue]
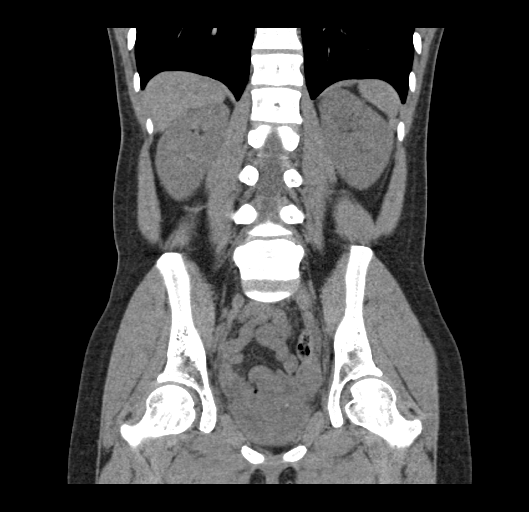

[17 of 46 positions shown; findings below may reference images not displayed]

FINDINGS: Lower chest: No acute abnormality

Hepatobiliary: No focal liver abnormality is seen. No gallstones,
gallbladder wall thickening, or biliary dilatation.

Pancreas: Unremarkable. No pancreatic ductal dilatation or
surrounding inflammatory changes.

Spleen: Normal in size without focal abnormality.

Adrenals/Urinary Tract: No adrenal nodule bilaterally. There is a 2
mm calcified stone within the inferior pole of the left kidney. No
right nephrolithiasis. No hydronephrosis, and no contour-deforming
renal mass. Suggestion of a 2 mm calcified left ureterovesicular
junction stone. No right ureterolithiasis. No hydroureter. The
urinary bladder is unremarkable.

Stomach/Bowel: Stomach is within normal limits. The appendix is not
definitely identified. No evidence of bowel wall thickening,
distention, or inflammatory changes.

Vascular/Lymphatic: No abdominal aorta or iliac aneurysm. No
abdominal, pelvic, or inguinal lymphadenopathy. Multiple phleboliths
are identified within the pelvis.

Reproductive: Status post hysterectomy. No adnexal masses. Bilateral
ovaries appear normal in size.

Other: No intraperitoneal free fluid. No intraperitoneal free gas.
No organized fluid collection.

Musculoskeletal: No acute or significant osseous findings.
IMPRESSION: 1. Suggestion of a nonobstructive 2 mm left ureterovesicular
junction stone.
2. Nonobstructive 2 mm left nephrolithiasis.

## 2022-03-29 IMAGING — CT CT ABD-PELV W/ CM
1 of 2 series · 14 of 32 positions shown, 19 images · IV contrast (APPLIED)
Comparison: CT dated 09/07/2020.

CLINICAL DATA: Generalized abdominal pain.

EXAM:
CT ABDOMEN AND PELVIS WITH CONTRAST
TECHNIQUE: Multidetector CT imaging of the abdomen and pelvis was performed
using the standard protocol following bolus administration of
intravenous contrast.
CONTRAST:  80mL [id number] IOPAMIDOL ([id number]) INJECTION 76%

[Series 2: abd/pelvis w/cm · axial · 0.61mm/px · z∈[-381,-31]mm · 14 of 78 slices shown, 19 images]
[im 4/78  soft-tissue]
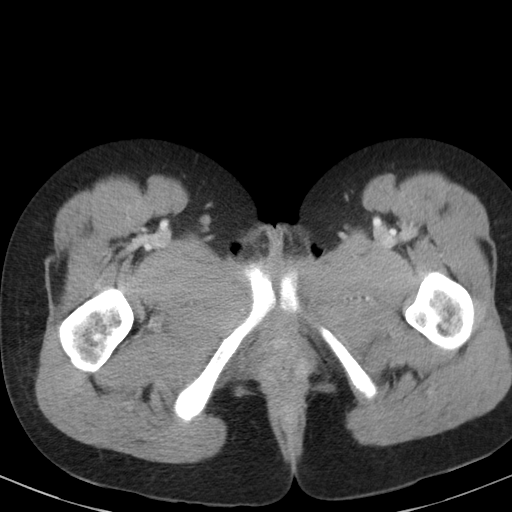
[im 4/78  bone]
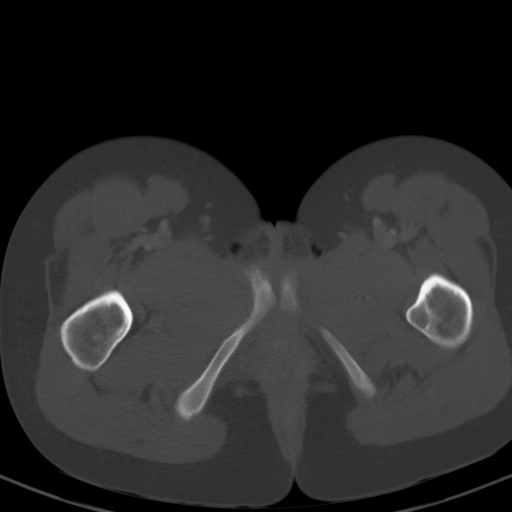
[im 12/78  soft-tissue]
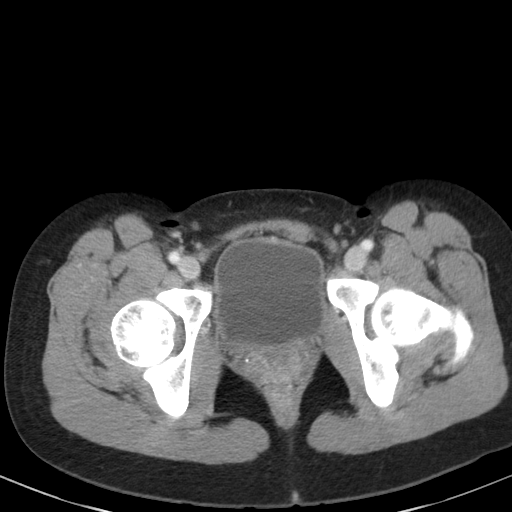
[im 16/78  soft-tissue]
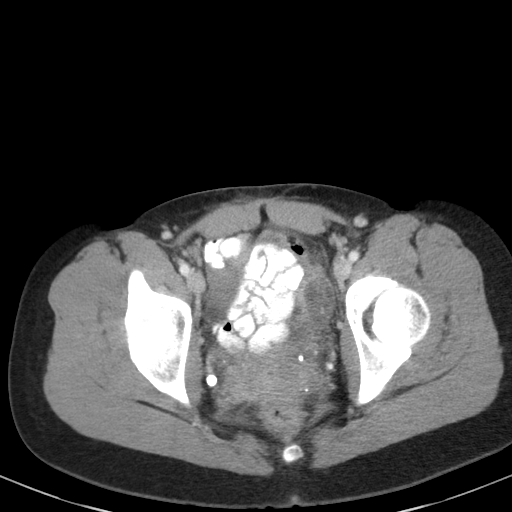
[im 24/78  soft-tissue]
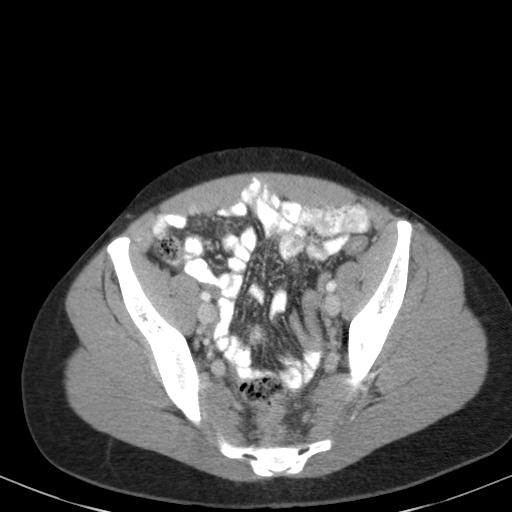
[im 27/78  soft-tissue]
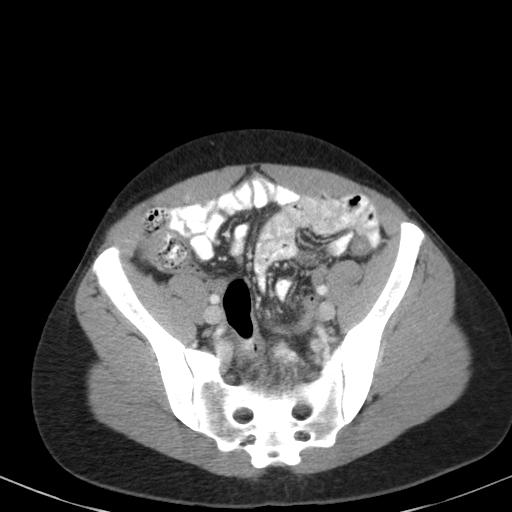
[im 35/78  soft-tissue]
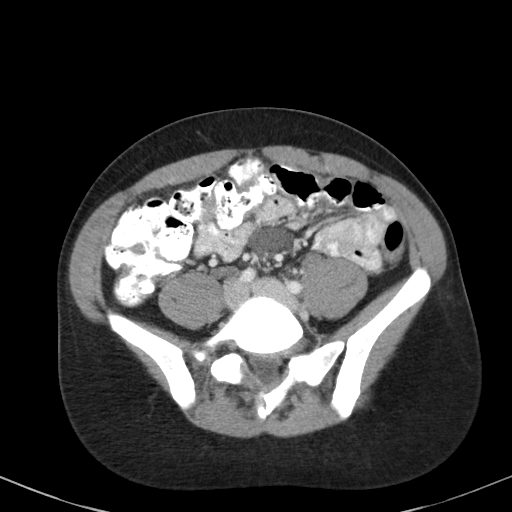
[im 39/78  soft-tissue]
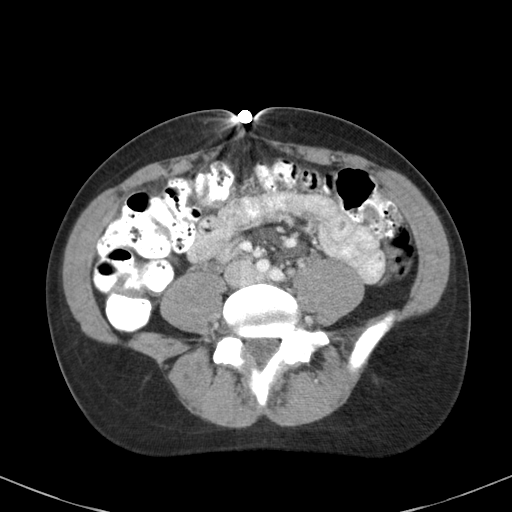
[im 43/78  soft-tissue]
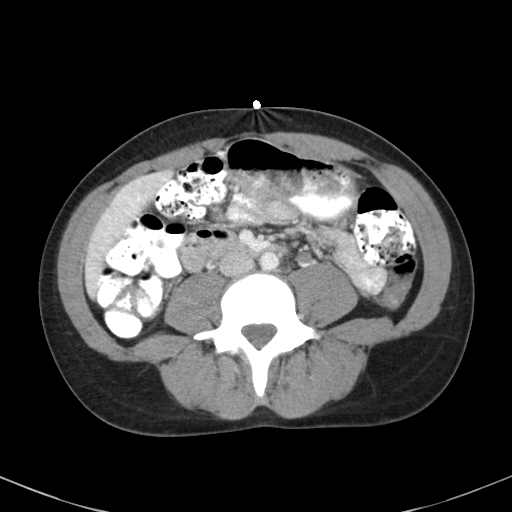
[im 51/78  soft-tissue]
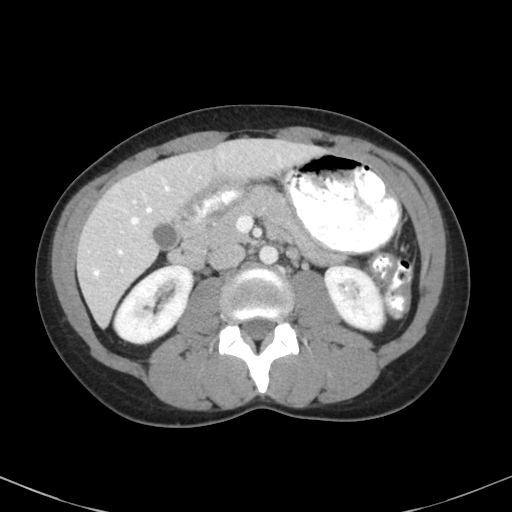
[im 51/78  bone]
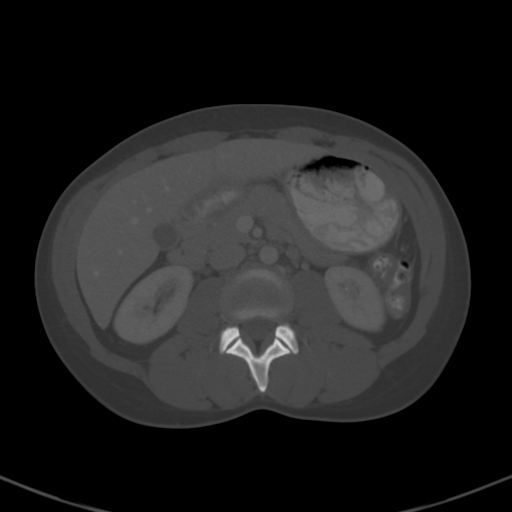
[im 54/78  soft-tissue]
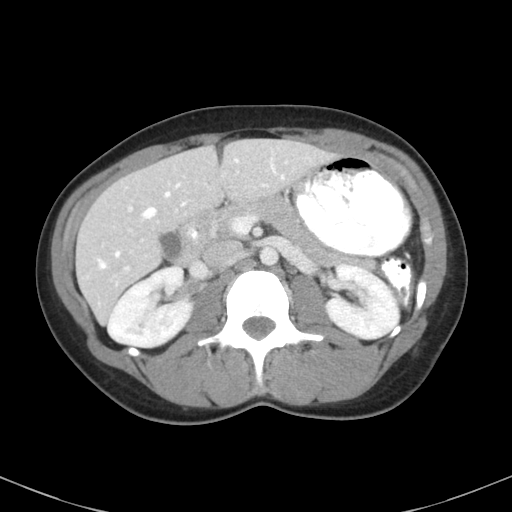
[im 62/78  soft-tissue]
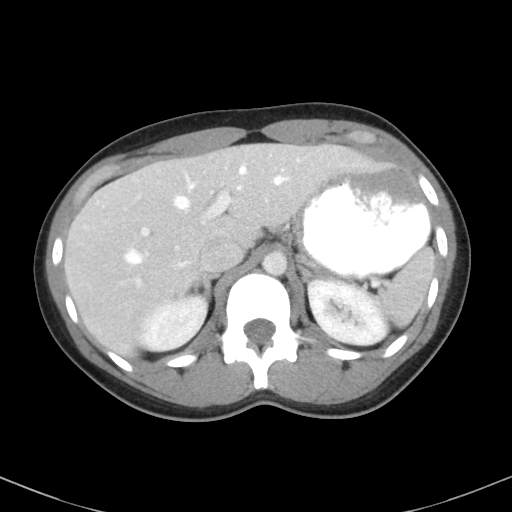
[im 62/78  lung]
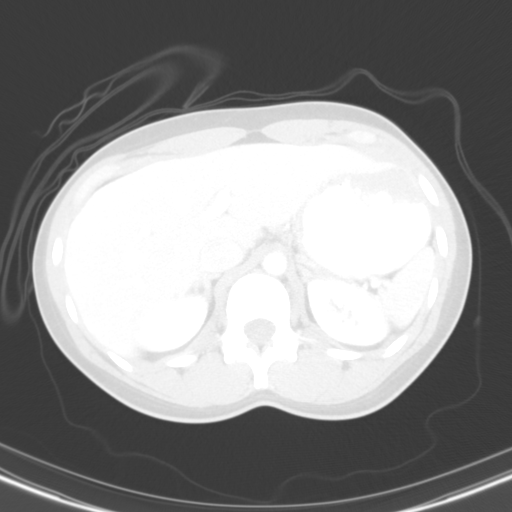
[im 66/78  soft-tissue]
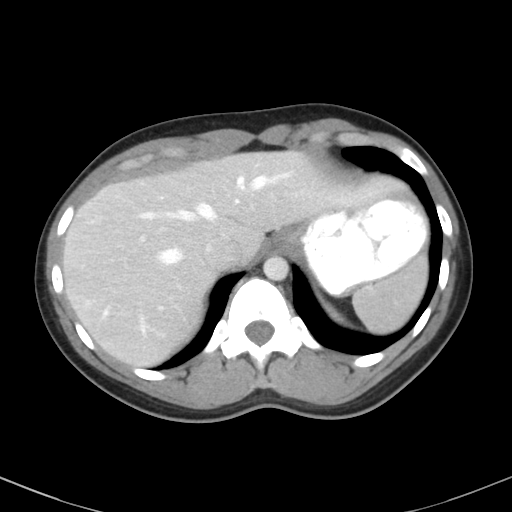
[im 66/78  lung]
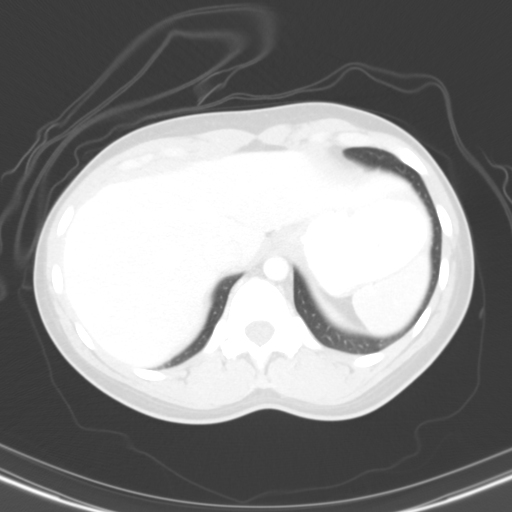
[im 70/78  lung]
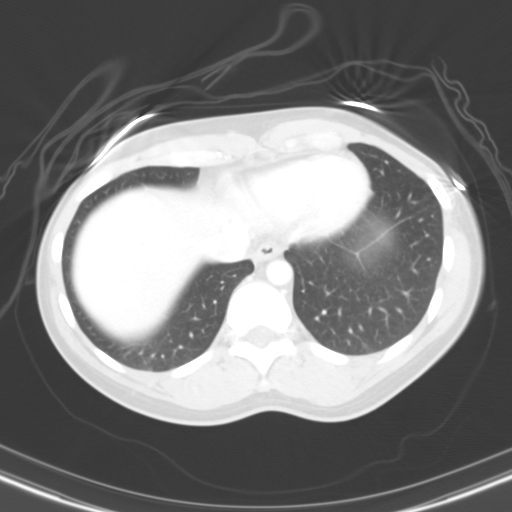
[im 74/78  soft-tissue]
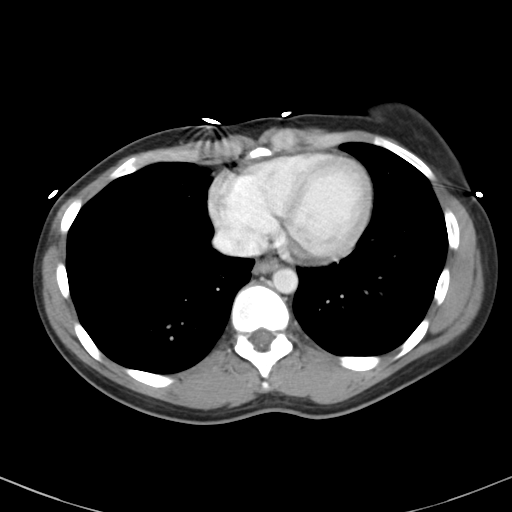
[im 74/78  lung]
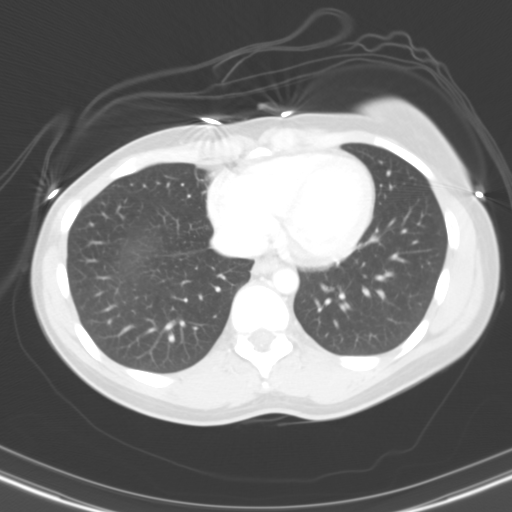

[14 of 32 positions shown; findings below may reference images not displayed]

FINDINGS: Lower chest: The lung bases are clear. The heart size is normal.

Hepatobiliary: The liver is normal. Normal gallbladder.There is no
biliary ductal dilation.

Pancreas: Normal contours without ductal dilatation. No
peripancreatic fluid collection.

Spleen: Unremarkable.

Adrenals/Urinary Tract:

--Adrenal glands: Unremarkable.

--Right kidney/ureter: No hydronephrosis or radiopaque kidney
stones.

--Left kidney/ureter: There is a punctate nonobstructing stone in
the lower pole the left kidney.

--Urinary bladder: Unremarkable.

Stomach/Bowel:

--Stomach/Duodenum: No hiatal hernia or other gastric abnormality.
Normal duodenal course and caliber.

--Small bowel: Unremarkable.

--Colon: Unremarkable.

--Appendix: Normal.

Vascular/Lymphatic: Normal course and caliber of the major abdominal
vessels.

--No retroperitoneal lymphadenopathy.

--No mesenteric lymphadenopathy.

--No pelvic or inguinal lymphadenopathy.

Reproductive: Status post hysterectomy. No adnexal mass.

Other: There is a small volume of pelvic free fluid which is likely
physiologic. No free air. The abdominal wall is normal.

Musculoskeletal. No acute displaced fractures.
IMPRESSION: 1. No acute abdominopelvic abnormality.
2. Punctate nonobstructing stone in the lower pole the left kidney.
3. Small volume of pelvic free fluid is likely physiologic.

## 2023-02-08 ENCOUNTER — Ambulatory Visit (INDEPENDENT_AMBULATORY_CARE_PROVIDER_SITE_OTHER): Payer: 59

## 2023-02-08 ENCOUNTER — Ambulatory Visit
Admission: EM | Admit: 2023-02-08 | Discharge: 2023-02-08 | Disposition: A | Discharge status: Home / Self Care | Payer: No Typology Code available for payment source

## 2023-02-08 DIAGNOSIS — R1010 Upper abdominal pain, unspecified: Secondary | ICD-10-CM

## 2023-02-08 DIAGNOSIS — R1084 Generalized abdominal pain: Secondary | ICD-10-CM

## 2023-02-08 DIAGNOSIS — K5909 Other constipation: Secondary | ICD-10-CM

## 2023-02-08 DIAGNOSIS — K648 Other hemorrhoids: Secondary | ICD-10-CM

## 2023-02-08 MED ORDER — HYDROCORTISONE ACETATE 25 MG RE SUPP: 25.0000 mg | Freq: Two times a day (BID) | RECTAL | 0 refills | Status: DC

## 2023-02-08 NOTE — ED Triage Notes (Addendum)
Pt reports she lost 10 pound in 3-4 days, nausea, feel something is blocking the anus  Last bowel movement 4 days ago.   Current weight at UC 106.5 lbs, reports her weight is between 110-115 lbs.

## 2023-02-08 NOTE — ED Provider Notes (Addendum)
[facility]    CSN: [id number] Arrival date & time: 02/08/23  1056      History   Chief Complaint Chief Complaint  Patient presents with   Hemorrhoids    Unable to have a bowel movement even with motegrity. Burning stomach. Lack of appetite. Weak. Lump and blockage. - Entered by patient    HPI [place] is a 37 y.o. female presents for evaluation of abdominal pain, constipation, chills.  Patient has a history of chronic idiopathic constipation has been seen multiple times by specialist.  She is currently on [place].  She states over the past 3 to 4 days she has not been able to have a bowel movement despite the medication.  She does feel bloated and has abdominal pain.  She reports she is unable to eat any food but can keep fluids down.  No vomiting but does endorse significant nausea.  Also has a history of hemorrhoids and she feels like she might have a hemorrhoid flare because she feels like there is a blockage in her anus.  Rectal bleeding.  She also notes a lump in her right groin over the past couple days as well.  No fevers but does endorse chills.  She has not taken any OTC laxative.  She also states she is lost 5 to 10 pounds in the past 3 to 4 days.  No other concerns at this time.  HPI  Past Medical History:  Diagnosis Date   Asthma    Gastric ulcer    IBS (irritable bowel syndrome)     Patient Active Problem List   Diagnosis Date Noted   Acute psychosis ([place])    Bipolar affective disorder, current episode manic with psychotic symptoms ([place]) 04/24/2021    Past Surgical History:  Procedure Laterality Date   ABDOMINAL HYSTERECTOMY     partial     OB History   No obstetric history on file.      Home Medications    Prior to Admission medications   Medication Sig Start Date End Date Taking? Authorizing Provider  hydrocortisone (ANUSOL-HC) 25 MG suppository Place 1 suppository (25 mg total) rectally 2 (two) times daily. 02/08/23  Yes Melynda Ripple, NP  MOTEGRITY 2 MG TABS Take by mouth. 04/04/22  Yes [provider]  haloperidol (HALDOL) 10 MG tablet Take 1 tablet (10 mg total) by mouth at bedtime. 05/14/21   Money, Lowry Ram, FNP  haloperidol (HALDOL) 5 MG tablet Take 1 tablet (5 mg total) by mouth daily. 05/14/21   Money, Lowry Ram, FNP  pantoprazole (PROTONIX) 40 MG tablet Take 1 tablet (40 mg total) by mouth daily. 05/14/21   Money, Lowry Ram, FNP  QUEtiapine (SEROQUEL) 300 MG tablet Take 300 mg by mouth at bedtime.    [provider]  traZODone (DESYREL) 50 MG tablet Take 1 tablet (50 mg total) by mouth at bedtime as needed for sleep. 05/14/21   Money, Lowry Ram, FNP    Family History History reviewed. No pertinent family history.  Social History Social History   Tobacco Use   Smoking status: Every Day    Packs/day: 0.20    Types: Cigarettes   Smokeless tobacco: Never  Substance Use Topics   Alcohol use: Yes    Comment: rarely   Drug use: No     Allergies   Hydrocodone-acetaminophen, Vicodin [hydrocodone-acetaminophen], Aspirin, and Penicillin v   Review of Systems Review of Systems  Gastrointestinal:  Positive for abdominal distention, abdominal pain, constipation  and nausea.     Physical Exam Triage Vital Signs ED Triage Vitals  Enc Vitals Group     BP 02/08/23 1236 111/80     Pulse Rate 02/08/23 1236 (!) 104     Resp 02/08/23 1236 16     Temp 02/08/23 1236 99 F (37.2 C)     Temp Source 02/08/23 1236 Oral     SpO2 02/08/23 1236 97 %     Weight --      Height --      Head Circumference --      Peak Flow --      Pain Score 02/08/23 1240 0     Pain Loc --      Pain Edu? --      Excl. in [place]? --    No data found.  Updated Vital Signs BP 111/80 (BP Location: Right Arm)   Pulse (!) 104   Temp 99 F (37.2 C) (Oral)   Resp 16   SpO2 97%   Visual Acuity Right Eye Distance:   Left Eye Distance:   Bilateral Distance:    Right Eye Near:   Left Eye Near:    Bilateral Near:      Physical Exam Vitals reviewed.  Constitutional:      Appearance: Normal appearance.  HENT:     Head: Normocephalic and atraumatic.  Eyes:     Pupils: Pupils are equal, round, and reactive to light.  Cardiovascular:     Rate and Rhythm: Normal rate.  Pulmonary:     Effort: Pulmonary effort is normal.  Abdominal:     General: Abdomen is flat. Bowel sounds are decreased.     Palpations: There is no hepatomegaly or splenomegaly.     Tenderness: There is generalized abdominal tenderness.       Comments: Norlene Duel, RN present during rectal exam   Rectal exam with no external hemorrhoids noticed but severe tenderness with mild pressure to rectum.  Hemorrhoid on inner aspect of rectum that is very tender to palpation.  No bleeding on exam.  Skin:    General: Skin is warm and dry.  Neurological:     General: No focal deficit present.     Mental Status: She is alert and oriented to person, place, and time.  Psychiatric:        Mood and Affect: Mood normal.        Behavior: Behavior normal.      UC Treatments / Results  Labs (all labs ordered are listed, but only abnormal results are displayed) Labs Reviewed - No data to display  EKG   Radiology DG Abd 1 View  Result Date: 02/08/2023 CLINICAL DATA:  Constipation with abdominal pain. Last bowel movement 4 days ago. EXAM: ABDOMEN - 1 VIEW COMPARISON:  CT abdomen and pelvis 04/12/2021 FINDINGS: There is a 2 mm calcific density overlying the lower pole of left kidney that appears to be an unchanged nonobstructing stone within the lower pole of the left kidney seen on prior 04/12/2021 CT. Stool is seen within the descending and sigmoid colon. Overall paucity of small bowel gas. No dilated loops of bowel to indicate bowel obstruction. No portal venous gas or pneumatosis. Evaluation for pneumoperitoneum is limited by supine technique, however none is identified. Numerous vascular phleboliths again overlie the pelvis. No significant  skeletal abnormality. IMPRESSION: 1. Nonobstructive bowel gas pattern. 2. Unchanged 2 mm nonobstructing stone within the lower pole of the left kidney as seen on prior 04/12/2021 CT.  Electronically Signed   By: Yvonne Kendall M.D.   On: 02/08/2023 13:50    Procedures Procedures (including critical care time)  Medications Ordered in UC Medications - No data to display  Initial Impression / Assessment and Plan / UC Course  I have reviewed the triage vital signs and the nursing notes.  Pertinent labs & imaging results that were available during my care of the patient were reviewed by me and considered in my medical decision making (see chart for details).     Reviewed exam and symptoms with patient.  Discussed likely hemorrhoid.  Unclear cause of lymphadenopathy.  She does have low-grade fever in clinic at 53 but otherwise no signs or symptoms of infection.  Discussed this at length with patient.  Patient reports significant weight loss in a few days.  Could be from not eating.  Discussed ER evaluation but patient declines at this time.  Will do Anusol suppositories and patient instructed if she has no improvement in symptoms in 24 hours or anything worsens or new symptoms occur she is to go to the ER ASAP and she verbalized understanding Advised nutrition with protein shakes, Ensure, boost, etc. Increase fluids Patient to follow-up with her PCP in 2 days for recheck Final Clinical Impressions(s) / UC Diagnoses   Final diagnoses:  Pain of upper abdomen  Chronic constipation  Internal hemorrhoid  Generalized abdominal pain     Discharge Instructions      Anusol suppositories for hemorrhoid Increase fluids Try to increase your nutrition with protein shakes, boost, Ensure Please follow-up with your PCP in 2 days for recheck Please go to the emergency room if you have any worsening symptoms.  This includes but is not limited to continued constipation, fevers, or any new concerns that  arise     ED Prescriptions     Medication Sig Dispense Auth. Provider   hydrocortisone (ANUSOL-HC) 25 MG suppository Place 1 suppository (25 mg total) rectally 2 (two) times daily. 12 suppository Melynda Ripple, NP      PDMP not reviewed this encounter.   Melynda Ripple, NP 02/08/23 1408    Melynda Ripple, NP 02/08/23 1409    Melynda Ripple, NP 02/08/23 1410

## 2023-02-08 NOTE — Discharge Instructions (Addendum)
Anusol suppositories for hemorrhoid Increase fluids Try to increase your nutrition with protein shakes, boost, Ensure Please follow-up with your PCP in 2 days for recheck Please go to the emergency room if you have any worsening symptoms.  This includes but is not limited to continued constipation, fevers, or any new concerns that arise

## 2023-02-11 ENCOUNTER — Emergency Department (HOSPITAL_BASED_OUTPATIENT_CLINIC_OR_DEPARTMENT_OTHER): Payer: No Typology Code available for payment source | Admitting: Radiology

## 2023-02-11 ENCOUNTER — Encounter (HOSPITAL_BASED_OUTPATIENT_CLINIC_OR_DEPARTMENT_OTHER): Payer: Self-pay | Admitting: Emergency Medicine

## 2023-02-11 ENCOUNTER — Emergency Department (HOSPITAL_BASED_OUTPATIENT_CLINIC_OR_DEPARTMENT_OTHER)
Admission: EM | Admit: 2023-02-11 | Discharge: 2023-02-11 | Disposition: A | Discharge status: Home / Self Care | Payer: No Typology Code available for payment source | Attending: Emergency Medicine | Admitting: Emergency Medicine

## 2023-02-11 ENCOUNTER — Other Ambulatory Visit: Payer: Self-pay

## 2023-02-11 DIAGNOSIS — R630 Anorexia: Secondary | ICD-10-CM | POA: Diagnosis not present

## 2023-02-11 DIAGNOSIS — R6883 Chills (without fever): Secondary | ICD-10-CM | POA: Diagnosis not present

## 2023-02-11 DIAGNOSIS — M791 Myalgia, unspecified site: Secondary | ICD-10-CM

## 2023-02-11 DIAGNOSIS — R531 Weakness: Secondary | ICD-10-CM

## 2023-02-11 DIAGNOSIS — J45909 Unspecified asthma, uncomplicated: Secondary | ICD-10-CM | POA: Insufficient documentation

## 2023-02-11 LAB — CBC
HCT: 35.4 % — ABNORMAL LOW (ref 36.0–46.0)
Hemoglobin: 12.1 g/dL (ref 12.0–15.0)
MCH: 32.6 pg (ref 26.0–34.0)
MCHC: 34.2 g/dL (ref 30.0–36.0)
MCV: 95.4 fL (ref 80.0–100.0)
Platelets: 217 10*3/uL (ref 150–400)
RBC: 3.71 MIL/uL — ABNORMAL LOW (ref 3.87–5.11)
RDW: 12.7 % (ref 11.5–15.5)
WBC: 5.2 10*3/uL (ref 4.0–10.5)
nRBC: 0 % (ref 0.0–0.2)

## 2023-02-11 LAB — BASIC METABOLIC PANEL
Anion gap: 7 (ref 5–15)
BUN: 10 mg/dL (ref 6–20)
CO2: 25 mmol/L (ref 22–32)
Calcium: 9.4 mg/dL (ref 8.9–10.3)
Chloride: 105 mmol/L (ref 98–111)
Creatinine, Ser: 0.78 mg/dL (ref 0.44–1.00)
GFR, Estimated: >60 mL/min (ref >60)
Glucose, Bld: 99 mg/dL (ref 70–99)
Potassium: 3.7 mmol/L (ref 3.5–5.1)
Sodium: 137 mmol/L (ref 135–145)

## 2023-02-11 LAB — URINALYSIS, ROUTINE W REFLEX MICROSCOPIC
Bilirubin Urine: NEGATIVE
Glucose, UA: NEGATIVE mg/dL
Hgb urine dipstick: NEGATIVE
Ketones, ur: NEGATIVE mg/dL
Leukocytes,Ua: NEGATIVE
Nitrite: NEGATIVE
Specific Gravity, Urine: 1.03 (ref 1.005–1.030)
pH: 6 (ref 5.0–8.0)

## 2023-02-11 LAB — CBG MONITORING, ED: Glucose-Capillary: 104 mg/dL — ABNORMAL HIGH (ref 70–99)

## 2023-02-11 LAB — PREGNANCY, URINE: Preg Test, Ur: NEGATIVE

## 2023-02-11 NOTE — ED Provider Notes (Signed)
[facility] Provider Note   CSN: [id number] Arrival date & time: 02/11/23  1745     History  Chief Complaint  Patient presents with   Weakness    Sierra Ortiz is a 37 y.o. female.  Patient presents the emergency room complaining of generalized weakness over the past week.  She states that on Saturday she was evaluated at urgent care for constipation and diagnosed with internal hemorrhoids.  She states she had a bowel movement today but has been having some chills, weakness and some decrease in her appetite.  The patient is concerned that she may have some sort of issue with a kidney stone due to previous imaging which showed the same.  She denies fevers, respiratory symptoms, cough, abdominal pain, flank pain, shortness of breath, chest pain.  Endorses myalgias, weakness, chills.  Patient has past medical history significant for psychosis, bipolar affective disorder, IBS, asthma, gastric ulcer  HPI     Home Medications Prior to Admission medications   Medication Sig Start Date End Date Taking? Authorizing Provider  haloperidol (HALDOL) 10 MG tablet Take 1 tablet (10 mg total) by mouth at bedtime. 05/14/21   Money, Lowry Ram, FNP  haloperidol (HALDOL) 5 MG tablet Take 1 tablet (5 mg total) by mouth daily. 05/14/21   Money, Lowry Ram, FNP  hydrocortisone (ANUSOL-HC) 25 MG suppository Place 1 suppository (25 mg total) rectally 2 (two) times daily. 02/08/23   Melynda Ripple, NP  MOTEGRITY 2 MG TABS Take by mouth. 04/04/22   [provider]  pantoprazole (PROTONIX) 40 MG tablet Take 1 tablet (40 mg total) by mouth daily. 05/14/21   Money, Lowry Ram, FNP  QUEtiapine (SEROQUEL) 300 MG tablet Take 300 mg by mouth at bedtime.    [provider]  traZODone (DESYREL) 50 MG tablet Take 1 tablet (50 mg total) by mouth at bedtime as needed for sleep. 05/14/21   Money, Lowry Ram, FNP      Allergies    Hydrocodone-acetaminophen, Vicodin  [hydrocodone-acetaminophen], Aspirin, and Penicillin v    Review of Systems   Review of Systems  Constitutional:  Positive for chills. Negative for fever.  Respiratory:  Negative for cough.   Cardiovascular:  Negative for chest pain.  Gastrointestinal:  Positive for constipation. Negative for abdominal pain, diarrhea, nausea and vomiting.  Genitourinary:  Negative for dysuria and hematuria.  Musculoskeletal:  Positive for myalgias.  Neurological:  Positive for weakness.    Physical Exam Updated Vital Signs BP 126/86   Pulse (!) 102   Temp 98.7 F (37.1 C) (Oral)   Resp (!) 32   Ht '5\' 2"'$  (1.575 m)   Wt 46.7 kg   SpO2 91%   BMI 18.84 kg/m  Physical Exam Vitals and nursing note reviewed.  Constitutional:      General: She is not in acute distress.    Appearance: She is well-developed.  HENT:     Head: Normocephalic and atraumatic.  Eyes:     Conjunctiva/sclera: Conjunctivae normal.  Cardiovascular:     Rate and Rhythm: Normal rate and regular rhythm.     Heart sounds: No murmur heard.    Comments: Intermittent tachycardia Pulmonary:     Effort: Pulmonary effort is normal. No respiratory distress.     Breath sounds: Normal breath sounds.  Abdominal:     Palpations: Abdomen is soft.     Tenderness: There is no abdominal tenderness.  Musculoskeletal:        General: No  swelling.     Cervical back: Neck supple.  Skin:    General: Skin is warm and dry.     Capillary Refill: Capillary refill takes less than 2 seconds.  Neurological:     Mental Status: She is alert.  Psychiatric:        Mood and Affect: Mood normal.     ED Results / Procedures / Treatments   Labs (all labs ordered are listed, but only abnormal results are displayed) Labs Reviewed  CBC - Abnormal; Notable for the following components:      Result Value   RBC 3.71 (*)    HCT 35.4 (*)    All other components within normal limits  URINALYSIS, ROUTINE W REFLEX MICROSCOPIC - Abnormal; Notable for the  following components:   Protein, ur TRACE (*)    All other components within normal limits  CBG MONITORING, ED - Abnormal; Notable for the following components:   Glucose-Capillary 104 (*)    All other components within normal limits  BASIC METABOLIC PANEL  PREGNANCY, URINE    EKG None  Radiology DG Chest 2 View  Result Date: 02/11/2023 CLINICAL DATA:  Chills and weakness. EXAM: CHEST - 2 VIEW COMPARISON:  None Available. FINDINGS: The heart size and mediastinal contours are within normal limits. Both lungs are clear. The visualized skeletal structures are unremarkable. IMPRESSION: No active cardiopulmonary disease. Electronically Signed   By: Virgina Norfolk M.D.   On: 02/11/2023 22:46    Procedures Procedures    Medications Ordered in ED Medications - No data to display  ED Course/ Medical Decision Making/ A&P                             Medical Decision Making Amount and/or Complexity of Data Reviewed Labs: ordered. Radiology: ordered.   Patient with chief complaint of weakness, chills, myalgias.  Differential diagnosis includes but is not limited to viral infection, electrolyte abnormality, and others  I have read the patient's past medical history.  She is comorbidities include anxiety  I read urgent care notes from the previous Saturday on February 24 with the patient was diagnosed with an internal hemorrhoid  I ordered and reviewed labs.  Pertinent results include: Unremarkable CBC, BMP, urinalysis, negative pregnancy test  I ordered and interpreted chest x-ray which showed no acute findings.  I agree with radiologist findings.  The patient's symptoms seem consistent with a viral infection.  She is afebrile at this time.  Her heart rate was in the 90s until I entered the room and it spiked to approximately 120.  I discussed anxiety with the patient and patient states she does have significant anxiety and is currently taking Seroquel for the same.  She states that  Seroquel has intermittently caused tachycardia as well.  She is currently talking to her primary care team about adjusting this medication.  She has no abdominal tenderness to suggest a surgical abdomen at this time.  She is not septic.  I considered respiratory testing but the patient denies any respiratory symptoms at this time so I doubt COVID, for influenza.  Plan to discharge home with recommendations for over-the-counter medications for chills and myalgias including ibuprofen, Tylenol.  Patient plans to follow-up with her primary care provider.  If she develops severe abdominal pain, chest pain, shortness of breath, patient will return to the emergency room for reevaluation.        Final Clinical Impression(s) / ED Diagnoses  Final diagnoses:  Myalgia  Weakness    Rx / DC Orders ED Discharge Orders     None         Ronny Bacon 02/11/23 2341    Lorelle Gibbs, DO 02/12/23 0003

## 2023-02-11 NOTE — ED Notes (Signed)
Patient transported to X-ray 

## 2023-02-11 NOTE — Discharge Instructions (Signed)
You were evaluated today for weakness and muscle pain.  The symptoms seem consistent with a viral process.  Please take over-the-counter medications such as Tylenol and ibuprofen as needed for symptom management and follow-up with your primary care team.  If you develop chest pain, severe abdominal pain, or other life-threatening symptoms please return to the emergency department for reevaluation

## 2023-02-11 NOTE — ED Triage Notes (Signed)
Pt via pov from home with weakness x 1 week. She has hx of constipation and hemorrhoids and was seen for this on Saturday; she has had a bm today but c/o chills, weakness, anorexia. She believes that this is related to kidney stones. Pt alert & oriented, nad noted.

## 2023-02-11 NOTE — ED Notes (Signed)
Reviewed AVS with patient, patient expressed understanding of directions, denies further questions at this time.

## 2023-02-11 NOTE — ED Notes (Signed)
Patient resting quietly in stretcher, respirations even, unlabored, no acute distress noted. Denies needs at this time.

## 2023-03-31 ENCOUNTER — Emergency Department (EMERGENCY_DEPARTMENT_HOSPITAL)
Admission: EM | Admit: 2023-03-31 | Discharge: 2023-04-01 | Disposition: A | Discharge status: Home / Self Care | Payer: No Typology Code available for payment source | Source: Home / Self Care | Attending: Emergency Medicine | Admitting: Emergency Medicine

## 2023-03-31 ENCOUNTER — Other Ambulatory Visit: Payer: Self-pay

## 2023-03-31 ENCOUNTER — Encounter (HOSPITAL_COMMUNITY): Payer: Self-pay | Admitting: Emergency Medicine

## 2023-03-31 DIAGNOSIS — Z1152 Encounter for screening for COVID-19: Secondary | ICD-10-CM | POA: Insufficient documentation

## 2023-03-31 DIAGNOSIS — F1220 Cannabis dependence, uncomplicated: Secondary | ICD-10-CM | POA: Insufficient documentation

## 2023-03-31 DIAGNOSIS — F312 Bipolar disorder, current episode manic severe with psychotic features: Secondary | ICD-10-CM | POA: Diagnosis not present

## 2023-03-31 DIAGNOSIS — J45909 Unspecified asthma, uncomplicated: Secondary | ICD-10-CM | POA: Insufficient documentation

## 2023-03-31 DIAGNOSIS — F23 Brief psychotic disorder: Secondary | ICD-10-CM | POA: Insufficient documentation

## 2023-03-31 DIAGNOSIS — F29 Unspecified psychosis not due to a substance or known physiological condition: Secondary | ICD-10-CM | POA: Diagnosis not present

## 2023-03-31 DIAGNOSIS — F250 Schizoaffective disorder, bipolar type: Secondary | ICD-10-CM | POA: Diagnosis not present

## 2023-03-31 DIAGNOSIS — F17210 Nicotine dependence, cigarettes, uncomplicated: Secondary | ICD-10-CM | POA: Insufficient documentation

## 2023-03-31 LAB — COMPREHENSIVE METABOLIC PANEL
ALT: 16 U/L (ref 0–44)
AST: 19 U/L (ref 15–41)
Albumin: 4.8 g/dL (ref 3.5–5.0)
Alkaline Phosphatase: 42 U/L (ref 38–126)
Anion gap: 10 (ref 5–15)
BUN: 13 mg/dL (ref 6–20)
CO2: 25 mmol/L (ref 22–32)
Calcium: 10 mg/dL (ref 8.9–10.3)
Chloride: 102 mmol/L (ref 98–111)
Creatinine, Ser: 0.8 mg/dL (ref 0.44–1.00)
GFR, Estimated: >60 mL/min (ref >60)
Glucose, Bld: 107 mg/dL — ABNORMAL HIGH (ref 70–99)
Potassium: 4.1 mmol/L (ref 3.5–5.1)
Sodium: 137 mmol/L (ref 135–145)
Total Bilirubin: 1 mg/dL (ref 0.3–1.2)
Total Protein: 8.7 g/dL — ABNORMAL HIGH (ref 6.5–8.1)

## 2023-03-31 LAB — URINALYSIS, ROUTINE W REFLEX MICROSCOPIC
Bilirubin Urine: NEGATIVE
Glucose, UA: NEGATIVE mg/dL
Hgb urine dipstick: NEGATIVE
Ketones, ur: 20 mg/dL — AB
Nitrite: NEGATIVE
Protein, ur: 30 mg/dL — AB
Specific Gravity, Urine: 1.028 (ref 1.005–1.030)
pH: 6 (ref 5.0–8.0)

## 2023-03-31 LAB — CBC WITH DIFFERENTIAL/PLATELET
Abs Immature Granulocytes: 0.04 10*3/uL (ref 0.00–0.07)
Basophils Absolute: 0 10*3/uL (ref 0.0–0.1)
Basophils Relative: 0 %
Eosinophils Absolute: 0 10*3/uL (ref 0.0–0.5)
Eosinophils Relative: 0 %
HCT: 37.7 % (ref 36.0–46.0)
Hemoglobin: 12.7 g/dL (ref 12.0–15.0)
Immature Granulocytes: 0 %
Lymphocytes Relative: 17 %
Lymphs Abs: 1.6 10*3/uL (ref 0.7–4.0)
MCH: 32.6 pg (ref 26.0–34.0)
MCHC: 33.7 g/dL (ref 30.0–36.0)
MCV: 96.7 fL (ref 80.0–100.0)
Monocytes Absolute: 0.5 10*3/uL (ref 0.1–1.0)
Monocytes Relative: 6 %
Neutro Abs: 7.2 10*3/uL (ref 1.7–7.7)
Neutrophils Relative %: 77 %
Platelets: 251 10*3/uL (ref 150–400)
RBC: 3.9 MIL/uL (ref 3.87–5.11)
RDW: 13.3 % (ref 11.5–15.5)
WBC: 9.4 10*3/uL (ref 4.0–10.5)
nRBC: 0 % (ref 0.0–0.2)

## 2023-03-31 LAB — SALICYLATE LEVEL: Salicylate Lvl: <7 mg/dL — ABNORMAL LOW (ref 7.0–30.0)

## 2023-03-31 LAB — RAPID URINE DRUG SCREEN, HOSP PERFORMED
Amphetamines: NOT DETECTED
Barbiturates: NOT DETECTED
Benzodiazepines: NOT DETECTED
Cocaine: NOT DETECTED
Opiates: NOT DETECTED
Tetrahydrocannabinol: POSITIVE — AB

## 2023-03-31 LAB — ETHANOL: Alcohol, Ethyl (B): <10 mg/dL (ref <10)

## 2023-03-31 LAB — ACETAMINOPHEN LEVEL: Acetaminophen (Tylenol), Serum: <10 ug/mL — ABNORMAL LOW (ref 10–30)

## 2023-03-31 MED ORDER — RISPERIDONE 1 MG PO TABS
1.0000 mg | ORAL_TABLET | Freq: Two times a day (BID) | ORAL | Status: DC
  Administered 2023-03-31: 1 mg via ORAL
  Filled 2023-03-31 (×2): qty 1

## 2023-03-31 MED ORDER — RISPERIDONE 0.5 MG PO TBDP
2.0000 mg | ORAL_TABLET | Freq: Three times a day (TID) | ORAL | Status: DC | PRN
Start: 2023-03-31 — End: 2023-03-31

## 2023-03-31 MED ORDER — ZIPRASIDONE MESYLATE 20 MG IM SOLR
10.0000 mg | Freq: Two times a day (BID) | INTRAMUSCULAR | Status: DC | PRN
  Administered 2023-04-01: 10 mg via INTRAMUSCULAR
  Filled 2023-03-31: qty 20

## 2023-03-31 MED ORDER — HALOPERIDOL LACTATE 5 MG/ML IJ SOLN
5.0000 mg | Freq: Once | INTRAMUSCULAR | Status: AC
  Administered 2023-03-31: 5 mg via INTRAMUSCULAR
  Filled 2023-03-31: qty 1

## 2023-03-31 MED ORDER — LORAZEPAM 2 MG/ML PO CONC: 1.0000 mg | Freq: Once | ORAL | Status: DC

## 2023-03-31 MED ORDER — TRAZODONE HCL 50 MG PO TABS
50.0000 mg | ORAL_TABLET | Freq: Every day | ORAL | Status: DC
  Administered 2023-03-31: 50 mg via ORAL
  Filled 2023-03-31: qty 1

## 2023-03-31 MED ORDER — LORAZEPAM 1 MG PO TABS
1.0000 mg | ORAL_TABLET | Freq: Once | ORAL | Status: DC
  Filled 2023-03-31: qty 1

## 2023-03-31 MED ORDER — LORAZEPAM 1 MG PO TABS
1.0000 mg | ORAL_TABLET | ORAL | Status: DC | PRN
Start: 2023-03-31 — End: 2023-03-31

## 2023-03-31 MED ORDER — ZIPRASIDONE MESYLATE 20 MG IM SOLR
20.0000 mg | INTRAMUSCULAR | Status: DC | PRN
Start: 2023-03-31 — End: 2023-03-31

## 2023-03-31 MED ORDER — LORAZEPAM 2 MG/ML IJ SOLN
1.0000 mg | Freq: Once | INTRAMUSCULAR | Status: AC
  Administered 2023-03-31: 1 mg via INTRAMUSCULAR
  Filled 2023-03-31: qty 1

## 2023-03-31 MED ORDER — HALOPERIDOL 1 MG PO TABS
2.0000 mg | ORAL_TABLET | Freq: Once | ORAL | Status: DC
  Filled 2023-03-31: qty 2

## 2023-03-31 MED ORDER — HALOPERIDOL 5 MG PO TABS: 5.0000 mg | ORAL_TABLET | Freq: Once | ORAL | Status: AC

## 2023-03-31 MED ORDER — LORAZEPAM 1 MG PO TABS
1.0000 mg | ORAL_TABLET | Freq: Three times a day (TID) | ORAL | Status: DC | PRN
  Filled 2023-03-31: qty 1

## 2023-03-31 NOTE — ED Provider Notes (Signed)
[place] EMERGENCY DEPARTMENT AT [facility] Provider Note   CSN: [id number] Arrival date & time: 03/31/23  1118     History  Chief Complaint  Patient presents with   Psychiatric Evaluation    Sierra Ortiz is a 37 y.o. female.  With past medical history of asthma, IBS, bipolar who presents to the emergency department with psychosis.  Patient presents under IVC from behavioral health response team and [employer]. Level 5 caveat: Acute psychosis  Per IVC paperwork the patient was found outside of her apartment, naked, screaming about demons.  Also notes that she is hearing rats in her walls.   Family presented shortly after the patient to the emergency department.  They were spoken to in the lobby by charge nurse.  She was recently in a facility voluntarily in [place] about 2-1/2 weeks ago.  She was supposed to stay for 1 month but checked herself out after about 2 weeks as she was feeling better.  The patient states that she is not taking her medications.  She is responding to internal stimuli on my exam but is otherwise cooperative.     HPI     Home Medications Prior to Admission medications   Medication Sig Start Date End Date Taking? Authorizing Provider  haloperidol (HALDOL) 10 MG tablet Take 1 tablet (10 mg total) by mouth at bedtime. 05/14/21   Money, Gerlene Burdock, FNP  haloperidol (HALDOL) 5 MG tablet Take 1 tablet (5 mg total) by mouth daily. 05/14/21   Money, Gerlene Burdock, FNP  hydrocortisone (ANUSOL-HC) 25 MG suppository Place 1 suppository (25 mg total) rectally 2 (two) times daily. 02/08/23   Radford Pax, NP  MOTEGRITY 2 MG TABS Take by mouth. 04/04/22   [provider]  pantoprazole (PROTONIX) 40 MG tablet Take 1 tablet (40 mg total) by mouth daily. 05/14/21   Money, Gerlene Burdock, FNP  QUEtiapine (SEROQUEL) 300 MG tablet Take 300 mg by mouth at bedtime.    [provider]  traZODone (DESYREL) 50 MG tablet Take 1 tablet (50 mg total) by mouth at bedtime  as needed for sleep. 05/14/21   Money, Gerlene Burdock, FNP      Allergies    Hydrocodone-acetaminophen, Vicodin [hydrocodone-acetaminophen], Aspirin, and Penicillin v    Review of Systems   Review of Systems  Unable to perform ROS: Psychiatric disorder    Physical Exam Updated Vital Signs BP (!) 118/91 (BP Location: Left Arm)   Pulse 87   Temp 98.2 F (36.8 C) (Oral)   Resp 20   SpO2 100%  Physical Exam Vitals and nursing note reviewed.  HENT:     Head: Normocephalic and atraumatic.  Eyes:     General: No scleral icterus. Pulmonary:     Effort: Pulmonary effort is normal. No respiratory distress.  Skin:    Findings: No rash.  Neurological:     General: No focal deficit present.     Mental Status: She is alert.  Psychiatric:        Mood and Affect: Mood normal.        Behavior: Behavior normal.        Thought Content: Thought content normal.        Judgment: Judgment normal.    Physical exam is limited by patient's psychiatric condition.  She is in no acute distress.  Not ill-appearing.  She is oriented to year, month, place and self.  She does respond to internal stimuli throughout the exam.  She looks off to  the side and starts talking to an individual who is not there.  She also frequently is asking "do think he knows that?"    ED Results / Procedures / Treatments   Labs (all labs ordered are listed, but only abnormal results are displayed) Labs Reviewed  COMPREHENSIVE METABOLIC PANEL - Abnormal; Notable for the following components:      Result Value   Glucose, Bld 107 (*)    Total Protein 8.7 (*)    All other components within normal limits  ACETAMINOPHEN LEVEL - Abnormal; Notable for the following components:   Acetaminophen (Tylenol), Serum <10 (*)    All other components within normal limits  SALICYLATE LEVEL - Abnormal; Notable for the following components:   Salicylate Lvl <7.0 (*)    All other components within normal limits  ETHANOL  CBC WITH  DIFFERENTIAL/PLATELET  RAPID URINE DRUG SCREEN, HOSP PERFORMED  URINALYSIS, ROUTINE W REFLEX MICROSCOPIC    EKG EKG Interpretation  Date/Time:  Monday March 31 2023 12:29:48 EDT Ventricular Rate:  102 PR Interval:  120 QRS Duration: 72 QT Interval:  334 QTC Calculation: 435 R Axis:   71 Text Interpretation: Sinus tachycardia Otherwise normal ECG When compared with ECG of 11-Feb-2023 18:18, PREVIOUS ECG IS PRESENT Confirmed by Kristine Royal [phone]) on 03/31/2023 12:40:10 PM  Radiology No results found.  Procedures Procedures   Medications Ordered in ED Medications  LORazepam (ATIVAN) injection 1 mg (has no administration in time range)  haloperidol (HALDOL) tablet 5 mg ( Oral See Alternative 03/31/23 1351)    Or  haloperidol lactate (HALDOL) injection 5 mg (5 mg Intramuscular Given 03/31/23 1351)    ED Course/ Medical Decision Making/ A&P                             Medical Decision Making Amount and/or Complexity of Data Reviewed Labs: ordered.  Initial Impression and Ddx 37 year old female who presents to the emergency department with active psychosis Patient PMH that increases complexity of ED encounter: Bipolar, asthma  Interpretation of Diagnostics I independent reviewed and interpreted the labs as followed: med clearance work up including cbc, cmp, tylenol, salicylates, ethanol negative  - I independently visualized the following imaging with scope of interpretation limited to determining acute life threatening conditions related to emergency care: not indicated  Patient Reassessment and Ultimate Disposition/Management 37 year old female presents under IVC for active psychosis.  First exam was completed on my initial evaluation. She is alert and oriented.  Responding to internal stimuli on my exam.  Intermittently tearful. Paces in the room intermittently. She is not aggressive or agitated.   Med clearance work up initiated which showed no acute  findings.  Patient medically cleared at this time and stable for psychiatric evaluation/TTS and ultimate disposition. Will be kept under IVC for now. She was given  haldol at 1351 by psychiatry. Home meds have not yet been reviewed by pharmacology tech.Can be reordered once reviewed.  Patient management required discussion with the following services or consulting groups:  Psychiatry/TTS  Complexity of Problems Addressed Chronic illness with exacerbation  Additional Data Reviewed and Analyzed Further history obtained from: EMS on arrival, Further history from spouse/family member, Past medical history and medications listed in the EMR, Prior ED visit notes, and Care Everywhere  Patient Encounter Risk Assessment SDOH impact on management and Consideration of hospitalization  Final Clinical Impression(s) / ED Diagnoses Final diagnoses:  Acute psychosis    Rx / DC Orders  ED Discharge Orders     None         Lenard Galloway 03/31/23 1433    Wynetta Fines, MD 04/01/23 [phone]

## 2023-03-31 NOTE — ED Notes (Signed)
Attempted to complete EKG. Pt very sleepy att. Will re-attempt later

## 2023-03-31 NOTE — ED Notes (Addendum)
Pt currently on all 4s, crawling in the hallway, and talking to self. Will closely monitor

## 2023-03-31 NOTE — ED Notes (Signed)
Pt standing at nursing station with her eyes closed. This Clinical research associate encouraged pt to go to her room and get some rest. Pt strongly refuses.

## 2023-03-31 NOTE — ED Notes (Signed)
Pt has been at nursing station, asking the same questions and talking to self. Pt repetitively stating she wants to "call my mom". Per NP and RN, allowed pt to speak to her mom. Pt given phone to speak to her mom, behavior escalated while she was on the phone and pt threw hospital's phone on glass.

## 2023-03-31 NOTE — Consult Note (Signed)
BH ED ASSESSMENT   Reason for Consult:  Psychiatry evaluation Referring Physician:  ER Physician Patient Identification: Sierra Ortiz MRN:  [id number] ED Chief Complaint: Bipolar affective disorder, current episode manic with psychotic symptoms  Diagnosis:  Principal Problem:   Bipolar affective disorder, current episode manic with psychotic symptoms Active Problems:   Acute psychosis   Cannabis use disorder, moderate, dependence   ED Assessment Time Calculation: Start Time: 1752 Stop Time: 1817 Total Time in Minutes (Assessment Completion): 25   Subjective:   [name] is a 37 y.o. female patient admitted with previous hx of Bipolar disorder, Manic brought in by [employer] after she was found naked walking on the street.  On arrival patient was agitated, irritable and was under IVC taken out by her parents.  Patient signed herself out of a Psychiatric facility in [place] 2 and half weeks ago.  HPI:  On arrival to the room patient wanted to leave to go take care of her dog.  She was restless walking from her room to the Nursing station.  She was pushing doors, banging on doors and windows wanting to ;leave.  She could not sit for evaluation and after several redirecting patient was giving Haldol injection 5 mg IM.  Patient calmed down for an hour and took only 30 minutes nap. Patient agreed to speak with provider this evening.  She sat down and reported she could not remember why and how she came to the room.  She became tearful stating she is worried about her daughter whom she believes could get in the wrong situation.  Patient ended the assessment because she could not remember anything. Provider called patient's mother Sierra Ortiz  who with her step father offered collateral information.  MS Inocencio Homes reports that patient went tl [place] last month to seek mental health and substance abuse care.  She also reports that patient has had multiple inpatient Psychiatry care at  various hospitals including but not limited to [facility], [facility] [place], [facility]  and parents reports that all theses admissions none offered outpatient follow up and that they were not allowed to be involved in her care.  Mother reports that she gets two weeks stable after hospitalization and stops taking her medications.  Mother reports diagnosis of Schizoaffective disorder, Bipolar type mostly Manic.  Mother reports that patient frequently uses "weed" and that hospitals are quick to believe the Marijuana is causing her symptoms.  Mother reports strong Mental illness hx in both side of her family.  Maternal grandmother, uncle suffered from schizophrenia, Paranoia and depression.  Father's side of the family strong hx of Mental illness but unknown type.  Mother reports that patient is allergic to Haldol -Dystonic reaction and that she did not do well on Seroquel and Depakote.  Mother reports that patient remained stable on Risperidone.  Record reviewed from [facility] and [facility] shows she has been tried on various medications in the past but compliance is poor. Patient meets criteria for inpatient Psychiatry hospitalization for safety and stabilization.  We will seek placement at any facility with available bed.  We will start Risperidone  and agitation protocol as well while waiting for bed.  Past Psychiatric History: Schizoaffective disorder, Bipolar type, Cannabis abuse. Multiple Psychiatry inpatient hospitalization.  Known for non compliance with Medications and no outpatient Mental health provider. Mother reports two or more suicide attempt and last was last OD on medications which she immediately vomited out. Risk to Self or Others: Is the patient at risk to  self? Yes Has the patient been a risk to self in the past 6 months? Yes Has the patient been a risk to self within the distant past? Yes Is the patient a risk to others? Yes Has the patient been a risk to others in the past 6 months? No Has the patient been a  risk to others within the distant past? Yes  [place] Scale:  Flowsheet Row ED from 03/31/2023 in [facility] Emergency Department at [facility] from 02/11/2023 in [facility] Emergency Department at [facility] ED from 02/08/2023 in [facility] at [facility])  C-SSRS RISK CATEGORY No Risk No Risk No Risk       AIMS:  , , ,  ,   ASAM:    Substance Abuse:     Past Medical History:  Past Medical History:  Diagnosis Date   Asthma    Gastric ulcer    IBS (irritable bowel syndrome)     Past Surgical History:  Procedure Laterality Date   ABDOMINAL HYSTERECTOMY     partial    Family History: History reviewed. No pertinent family history. Family Psychiatric  History: Both side of her family suffers from mental illness-Paranoia, schizophrenia and schizoaffective disorder on mother's and father's side. Social History:  Social History   Substance and Sexual Activity  Alcohol Use Yes   Comment: rarely     Social History   Substance and Sexual Activity  Drug Use No    Social History   Socioeconomic History   Marital status: Single    Spouse name: Not on file   Number of children: Not on file   Years of education: Not on file   Highest education level: Not on file  Occupational History   Not on file  Tobacco Use   Smoking status: Some Days    Packs/day: .2    Types: Cigarettes   Smokeless tobacco: Never  Vaping Use   Vaping Use: Never used  Substance and Sexual Activity   Alcohol use: Yes    Comment: rarely   Drug use: No   Sexual activity: Not Currently  Other Topics Concern   Not on file  Social History Narrative   Not on file   Social Determinants of Health   Financial Resource Strain: Not on file  Food Insecurity: Not on file  Transportation Needs: Not on file  Physical Activity: Not on file  Stress: Not on file  Social Connections: Not on file   Additional Social History:    Allergies:   Allergies   Allergen Reactions   Hydrocodone-Acetaminophen Nausea And Vomiting    Patient reports allergy to the Hydrocodone portion of Vicodin.  She has taken and tolerated Tylenol in the past.   Vicodin [Hydrocodone-Acetaminophen] Nausea And Vomiting    Patient reports allergy to the Hydrocodone portion of Vicodin.  She has taken and tolerated Tylenol in the past.   Aspirin Other (See Comments) and Nausea And Vomiting    "Makes me talk out of my head." Other reaction(s): Dizziness (intolerance) "Makes me talk out of my head."   Penicillin V     Other reaction(s): Dizziness (intolerance), yeast infection    Labs:  Results for orders placed or performed during the hospital encounter of 03/31/23 (from the past 48 hour(s))  Comprehensive metabolic panel     Status: Abnormal   Collection Time: 03/31/23 12:30 PM  Result Value Ref Range   Sodium 137 135 - 145 mmol/L  Potassium 4.1 3.5 - 5.1 mmol/L   Chloride 102 98 - 111 mmol/L   CO2 25 22 - 32 mmol/L   Glucose, Bld 107 (H) 70 - 99 mg/dL    Comment: Glucose reference range applies only to samples taken after fasting for at least 8 hours.   BUN 13 6 - 20 mg/dL   Creatinine, Ser 4.09 0.44 - 1.00 mg/dL   Calcium 81.1 8.9 - 91.4 mg/dL   Total Protein 8.7 (H) 6.5 - 8.1 g/dL   Albumin 4.8 3.5 - 5.0 g/dL   AST 19 15 - 41 U/L   ALT 16 0 - 44 U/L   Alkaline Phosphatase 42 38 - 126 U/L   Total Bilirubin 1.0 0.3 - 1.2 mg/dL   GFR, Estimated >78 >29 mL/min    Comment: (NOTE) Calculated using the CKD-EPI Creatinine Equation (2021)    Anion gap 10 5 - 15    Comment: Performed at [facility], [address] [place], [place] [address]  CBC with Diff     Status: None   Collection Time: 03/31/23 12:30 PM  Result Value Ref Range   WBC 9.4 4.0 - 10.5 K/uL   RBC 3.90 3.87 - 5.11 MIL/uL   Hemoglobin 12.7 12.0 - 15.0 g/dL   HCT 08.6 57.8 - 46.9 %   MCV 96.7 80.0 - 100.0 fL   MCH 32.6 26.0 - 34.0 pg   MCHC 33.7 30.0 - 36.0 g/dL   RDW  62.9 52.8 - 41.3 %   Platelets 251 150 - 400 K/uL   nRBC 0.0 0.0 - 0.2 %   Neutrophils Relative % 77 %   Neutro Abs 7.2 1.7 - 7.7 K/uL   Lymphocytes Relative 17 %   Lymphs Abs 1.6 0.7 - 4.0 K/uL   Monocytes Relative 6 %   Monocytes Absolute 0.5 0.1 - 1.0 K/uL   Eosinophils Relative 0 %   Eosinophils Absolute 0.0 0.0 - 0.5 K/uL   Basophils Relative 0 %   Basophils Absolute 0.0 0.0 - 0.1 K/uL   Immature Granulocytes 0 %   Abs Immature Granulocytes 0.04 0.00 - 0.07 K/uL    Comment: Performed at [facility], [address], [place], [place] [address]  Ethanol     Status: None   Collection Time: 03/31/23 12:40 PM  Result Value Ref Range   Alcohol, Ethyl (B) <10 <10 mg/dL    Comment: (NOTE) Lowest detectable limit for serum alcohol is 10 mg/dL.  For medical purposes only. Performed at [facility], [address] [place], [place] [address]   Acetaminophen level     Status: Abnormal   Collection Time: 03/31/23 12:40 PM  Result Value Ref Range   Acetaminophen (Tylenol), Serum <10 (L) 10 - 30 ug/mL    Comment: (NOTE) Therapeutic concentrations vary significantly. A range of 10-30 ug/mL  may be an effective concentration for many patients. However, some  are best treated at concentrations outside of this range. Acetaminophen concentrations >150 ug/mL at 4 hours after ingestion  and >50 ug/mL at 12 hours after ingestion are often associated with  toxic reactions.  Performed at [facility], [address] [place], [place] [address]   Salicylate level     Status: Abnormal   Collection Time: 03/31/23 12:40 PM  Result Value Ref Range   Salicylate Lvl <7.0 (L) 7.0 - 30.0 mg/dL    Comment: Performed at [facility], [address] [place], [place]  [address]  Urine rapid drug screen (hosp performed)     Status: Abnormal   Collection Time: 03/31/23  4:17 PM  Result Value Ref Range   Opiates NONE DETECTED  NONE DETECTED   Cocaine NONE DETECTED NONE DETECTED   Benzodiazepines NONE DETECTED NONE DETECTED   Amphetamines NONE DETECTED NONE DETECTED   Tetrahydrocannabinol POSITIVE (A) NONE DETECTED   Barbiturates NONE DETECTED NONE DETECTED    Comment: (NOTE) DRUG SCREEN FOR MEDICAL PURPOSES ONLY.  IF CONFIRMATION IS NEEDED FOR ANY PURPOSE, NOTIFY LAB WITHIN 5 DAYS.  LOWEST DETECTABLE LIMITS FOR URINE DRUG SCREEN Drug Class                     Cutoff (ng/mL) Amphetamine and metabolites    1000 Barbiturate and metabolites    200 Benzodiazepine                 200 Opiates and metabolites        300 Cocaine and metabolites        300 THC                            50 Performed at [facility], [address] [place], [place] [address]   Urinalysis, Routine w reflex microscopic -Urine, Clean Catch     Status: Abnormal   Collection Time: 03/31/23  4:17 PM  Result Value Ref Range   Color, Urine YELLOW YELLOW   APPearance HAZY (A) CLEAR   Specific Gravity, Urine 1.028 1.005 - 1.030   pH 6.0 5.0 - 8.0   Glucose, UA NEGATIVE NEGATIVE mg/dL   Hgb urine dipstick NEGATIVE NEGATIVE   Bilirubin Urine NEGATIVE NEGATIVE   Ketones, ur 20 (A) NEGATIVE mg/dL   Protein, ur 30 (A) NEGATIVE mg/dL   Nitrite NEGATIVE NEGATIVE   Leukocytes,Ua TRACE (A) NEGATIVE   RBC / HPF 6-10 0 - 5 RBC/hpf   WBC, UA 0-5 0 - 5 WBC/hpf   Bacteria, UA FEW (A) NONE SEEN   Squamous Epithelial / HPF 6-10 0 - 5 /HPF   Mucus PRESENT    Hyaline Casts, UA PRESENT     Comment: Performed at [facility], [address], [place], [place] [address]    Current Facility-Administered Medications  Medication Dose Route Frequency Provider Last Rate Last Admin   LORazepam (ATIVAN) tablet 1 mg  1 mg Oral Q8H PRN Dahlia Byes C, NP       risperiDONE (RISPERDAL) tablet 1 mg  1 mg Oral BID Dahlia Byes C, NP       traZODone (DESYREL) tablet 50 mg  50 mg Oral QHS Dahlia Byes C, NP        [START ON 04/01/2023] ziprasidone (GEODON) injection 10 mg  10 mg Intramuscular Q12H PRN Earney Navy, NP       Current Outpatient Medications  Medication Sig Dispense Refill   haloperidol (HALDOL) 10 MG tablet Take 1 tablet (10 mg total) by mouth at bedtime.     haloperidol (HALDOL) 5 MG tablet Take 1 tablet (5 mg total) by mouth daily.     hydrocortisone (ANUSOL-HC) 25 MG suppository Place 1 suppository (25 mg total) rectally 2 (two) times daily. 12 suppository 0   MOTEGRITY 2 MG TABS Take by mouth.     pantoprazole (PROTONIX) 40 MG tablet Take 1 tablet (40 mg total) by mouth daily.     QUEtiapine (SEROQUEL) 300 MG tablet Take  300 mg by mouth at bedtime.     traZODone (DESYREL) 50 MG tablet Take 1 tablet (50 mg total) by mouth at bedtime as needed for sleep.      Musculoskeletal: Strength & Muscle Tone: within normal limits Gait & Station: normal Patient leans: Front   Psychiatric Specialty Exam: Presentation  General Appearance:  Casual; Neat  Eye Contact: Good  Speech: Clear and Coherent; Pressured  Speech Volume: Increased (yelling, cursing, rapid speech)  Handedness:No data recorded  Mood and Affect  Mood: Angry; Irritable; Labile  Affect: Congruent; Labile; Full Range   Thought Process  Thought Processes: Disorganized; Irrevelant  Descriptions of Associations:Tangential  Orientation:Partial  Thought Content:Illogical; Tangential  History of Schizophrenia/Schizoaffective disorder:No data recorded Duration of Psychotic Symptoms:No data recorded Hallucinations:Hallucinations: None  Ideas of Reference:None  Suicidal Thoughts:Suicidal Thoughts: No  Homicidal Thoughts:Homicidal Thoughts: No   Sensorium  Memory: Immediate Poor; Recent Poor; Remote Poor  Judgment: Impaired  Insight: Lacking   Executive Functions  Concentration: Poor  Attention Span: Poor  Recall: Poor  Fund of  Knowledge: Poor  Language: Poor   Psychomotor Activity  Psychomotor Activity:No data recorded  Assets  Assets: Communication Skills    Sleep  Sleep: Sleep: Poor   Physical Exam: Physical Exam-Unable to assess due to severe mental illness ROS-Unable to assess due to severe agitation. Blood pressure 109/81, pulse (!) 116, temperature 98.4 F (36.9 C), temperature source Oral, resp. rate 18, SpO2 95 %. There is no height or weight on file to calculate BMI.  Medical Decision Making: Patient meets criteria for inpatient mental healthcare.  We will seek placement at any facility with available Psychiatry bed.  Meanwhile Risperidone 1 mg bid for mood/Psychosis  Trazodone 50 mg po at bed time for sleep.  Agitation protocol using Geodon and Ativan in place and EKG ordered for QTC Interval when patient can cooperate.  Problem 1: Bipolar affective disorder, current episode Manic with Psychosis  Problem 2: Acute Psychosis.  Problem 3: Cannabis use disorder, Moderate dependence  Disposition:  Admit, seek bed placement.  Earney Navy, NP-PMHNP-BC 03/31/2023 6:49 PM

## 2023-03-31 NOTE — ED Notes (Signed)
Pt restless. Speeding up and down the hallway, talking to self.

## 2023-03-31 NOTE — ED Notes (Signed)
Pt given sprite. Pt sits on the floor, repetitively asking if she has an "STD". This Clinical research associate encourages her to stand up, informing pt if floor is unsanitary. Noted pt eventually stands up and goes to room

## 2023-03-31 NOTE — ED Triage Notes (Signed)
Pt BIB [employer] after witnessed running naked in the community.   Pt admitted under IVC orders:  Respondent is in active psychosis. She is seeing demons and beast. She hears rats in her walls. She is extremely resistant . Respondent has no sense of reality. She was found outside naked screaming about demons. She is a danger to self and others.

## 2023-04-01 ENCOUNTER — Inpatient Hospital Stay (HOSPITAL_COMMUNITY)
Admission: AD | Admit: 2023-04-01 | Discharge: 2023-04-08 | DRG: 885 | Disposition: A | Discharge status: Home / Self Care | Payer: No Typology Code available for payment source | Source: Intra-hospital | Attending: Psychiatry | Admitting: Psychiatry

## 2023-04-01 ENCOUNTER — Encounter (HOSPITAL_COMMUNITY): Payer: Self-pay | Admitting: Nurse Practitioner

## 2023-04-01 ENCOUNTER — Other Ambulatory Visit: Payer: Self-pay

## 2023-04-01 DIAGNOSIS — Z885 Allergy status to narcotic agent status: Secondary | ICD-10-CM

## 2023-04-01 DIAGNOSIS — Z886 Allergy status to analgesic agent status: Secondary | ICD-10-CM | POA: Diagnosis not present

## 2023-04-01 DIAGNOSIS — E559 Vitamin D deficiency, unspecified: Secondary | ICD-10-CM | POA: Diagnosis present

## 2023-04-01 DIAGNOSIS — F17210 Nicotine dependence, cigarettes, uncomplicated: Secondary | ICD-10-CM | POA: Diagnosis present

## 2023-04-01 DIAGNOSIS — M62838 Other muscle spasm: Secondary | ICD-10-CM | POA: Diagnosis not present

## 2023-04-01 DIAGNOSIS — Z1152 Encounter for screening for COVID-19: Secondary | ICD-10-CM | POA: Diagnosis not present

## 2023-04-01 DIAGNOSIS — F1220 Cannabis dependence, uncomplicated: Secondary | ICD-10-CM | POA: Diagnosis present

## 2023-04-01 DIAGNOSIS — Z90711 Acquired absence of uterus with remaining cervical stump: Secondary | ICD-10-CM | POA: Diagnosis not present

## 2023-04-01 DIAGNOSIS — Z818 Family history of other mental and behavioral disorders: Secondary | ICD-10-CM | POA: Diagnosis not present

## 2023-04-01 DIAGNOSIS — F17200 Nicotine dependence, unspecified, uncomplicated: Secondary | ICD-10-CM | POA: Diagnosis present

## 2023-04-01 DIAGNOSIS — F29 Unspecified psychosis not due to a substance or known physiological condition: Secondary | ICD-10-CM | POA: Diagnosis present

## 2023-04-01 DIAGNOSIS — F4310 Post-traumatic stress disorder, unspecified: Secondary | ICD-10-CM | POA: Diagnosis present

## 2023-04-01 DIAGNOSIS — Z8711 Personal history of peptic ulcer disease: Secondary | ICD-10-CM

## 2023-04-01 DIAGNOSIS — G4700 Insomnia, unspecified: Secondary | ICD-10-CM | POA: Diagnosis not present

## 2023-04-01 DIAGNOSIS — K219 Gastro-esophageal reflux disease without esophagitis: Secondary | ICD-10-CM | POA: Diagnosis present

## 2023-04-01 DIAGNOSIS — Z62811 Personal history of psychological abuse in childhood: Secondary | ICD-10-CM

## 2023-04-01 DIAGNOSIS — F250 Schizoaffective disorder, bipolar type: Principal | ICD-10-CM | POA: Diagnosis present

## 2023-04-01 DIAGNOSIS — J302 Other seasonal allergic rhinitis: Secondary | ICD-10-CM | POA: Diagnosis present

## 2023-04-01 DIAGNOSIS — Z79899 Other long term (current) drug therapy: Secondary | ICD-10-CM

## 2023-04-01 DIAGNOSIS — K589 Irritable bowel syndrome without diarrhea: Secondary | ICD-10-CM | POA: Diagnosis present

## 2023-04-01 DIAGNOSIS — Z91148 Patient's other noncompliance with medication regimen for other reason: Secondary | ICD-10-CM

## 2023-04-01 DIAGNOSIS — J45909 Unspecified asthma, uncomplicated: Secondary | ICD-10-CM | POA: Diagnosis present

## 2023-04-01 DIAGNOSIS — Z880 Allergy status to penicillin: Secondary | ICD-10-CM | POA: Diagnosis not present

## 2023-04-01 DIAGNOSIS — F411 Generalized anxiety disorder: Secondary | ICD-10-CM | POA: Diagnosis present

## 2023-04-01 DIAGNOSIS — Z888 Allergy status to other drugs, medicaments and biological substances status: Secondary | ICD-10-CM | POA: Diagnosis not present

## 2023-04-01 DIAGNOSIS — Z62810 Personal history of physical and sexual abuse in childhood: Secondary | ICD-10-CM

## 2023-04-01 DIAGNOSIS — F312 Bipolar disorder, current episode manic severe with psychotic features: Principal | ICD-10-CM | POA: Diagnosis present

## 2023-04-01 LAB — SARS CORONAVIRUS 2 BY RT PCR: SARS Coronavirus 2 by RT PCR: NEGATIVE

## 2023-04-01 MED ORDER — LORAZEPAM 1 MG PO TABS: 1.0000 mg | ORAL_TABLET | Freq: Three times a day (TID) | ORAL | Status: DC | PRN

## 2023-04-01 MED ORDER — LORAZEPAM 1 MG PO TABS
2.0000 mg | ORAL_TABLET | Freq: Four times a day (QID) | ORAL | Status: DC | PRN
  Administered 2023-04-02 – 2023-04-04 (×4): 2 mg via ORAL
  Filled 2023-04-01 (×4): qty 2

## 2023-04-01 MED ORDER — OLANZAPINE 10 MG PO TBDP: 10.0000 mg | ORAL_TABLET | Freq: Two times a day (BID) | ORAL | Status: DC | PRN

## 2023-04-01 MED ORDER — ALUM & MAG HYDROXIDE-SIMETH 200-200-20 MG/5ML PO SUSP: 30.0000 mL | ORAL | Status: DC | PRN

## 2023-04-01 MED ORDER — ZIPRASIDONE MESYLATE 20 MG IM SOLR: 10.0000 mg | Freq: Two times a day (BID) | INTRAMUSCULAR | Status: DC | PRN

## 2023-04-01 MED ORDER — LORAZEPAM 2 MG/ML IJ SOLN: 1.0000 mg | Freq: Four times a day (QID) | INTRAMUSCULAR | Status: DC | PRN

## 2023-04-01 MED ORDER — ZIPRASIDONE HCL 20 MG PO CAPS
20.0000 mg | ORAL_CAPSULE | Freq: Two times a day (BID) | ORAL | Status: DC | PRN
  Administered 2023-04-01 – 2023-04-02 (×3): 20 mg via ORAL
  Filled 2023-04-01 (×3): qty 1

## 2023-04-01 MED ORDER — ZIPRASIDONE MESYLATE 20 MG IM SOLR: 20.0000 mg | Freq: Two times a day (BID) | INTRAMUSCULAR | Status: DC | PRN

## 2023-04-01 MED ORDER — PANTOPRAZOLE SODIUM 40 MG PO TBEC
40.0000 mg | DELAYED_RELEASE_TABLET | Freq: Every day | ORAL | Status: DC
  Administered 2023-04-01 – 2023-04-08 (×8): 40 mg via ORAL
  Filled 2023-04-01 (×14): qty 1

## 2023-04-01 MED ORDER — MAGNESIUM HYDROXIDE 400 MG/5ML PO SUSP: 30.0000 mL | Freq: Every day | ORAL | Status: DC | PRN

## 2023-04-01 MED ORDER — STERILE WATER FOR INJECTION IJ SOLN
INTRAMUSCULAR | Status: AC
  Administered 2023-04-01: 1.2 mL
  Filled 2023-04-01: qty 10

## 2023-04-01 MED ORDER — RISPERIDONE 1 MG PO TBDP
1.0000 mg | ORAL_TABLET | Freq: Two times a day (BID) | ORAL | Status: DC
  Administered 2023-04-01 – 2023-04-03 (×4): 1 mg via ORAL
  Filled 2023-04-01 (×10): qty 1

## 2023-04-01 MED ORDER — TRAZODONE HCL 50 MG PO TABS
50.0000 mg | ORAL_TABLET | Freq: Every day | ORAL | Status: DC
  Administered 2023-04-01 – 2023-04-04 (×4): 50 mg via ORAL
  Filled 2023-04-01 (×7): qty 1

## 2023-04-01 MED ORDER — RISPERIDONE 1 MG PO TABS
1.0000 mg | ORAL_TABLET | Freq: Two times a day (BID) | ORAL | Status: DC
  Filled 2023-04-01 (×2): qty 1

## 2023-04-01 NOTE — ED Notes (Signed)
1 Patient belonging bag given to [facility] officers that transported patient to [facility].

## 2023-04-01 NOTE — Progress Notes (Signed)
LCSW Progress Note  [id number]   Sierra Ortiz  04/01/2023  9:47 AM  Description:   Inpatient Psychiatric Referral  Patient was recommended inpatient per Dahlia Byes, NP. There are no available beds at [facility]. Patient was referred to the following facilities:   Destination  Service Provider Address Phone Fax  [facility]  [address] [place] [address] [phone]  [facility]  [address] [place] [address] [phone]  [facility]  [address], [place] [address] [phone]  [facility]  [address] [place], [place] [address] [phone]  [facility] [address] [place]  [address], [place] [address] [phone]  [facility]  [address] [place], [place] [address] [phone]  [facility]  [address] [place] [address] [phone]  [facility]  [address] [place] [address] [phone]  [facility]  [address] [place] [address] [phone]  [facility]  [address] [place], [place] [address] [phone]  [facility]  [address] [place] [address] [phone]  [facility]  [address] [place] [address] [phone]  [facility]  [address] [place] [address] [phone]  [facility]  [address], [place] [address] [phone]  [employer]  [address] [place] [address] [phone]  [facility]  [address] [place] [address] [phone]   [facility]  [address] [place] [address] [phone]  [facility]  [address] [place] [address] [phone]  [facility]  [address], [place] [address] [phone]  [facility]  [address] [place] [address] [phone]  [facility]  [address] [place] [address] [phone]  [facility]  [address], [place] [address] [phone]  [facility]  [address] [place] [address] [phone]  [facility]  [address] [place] [address] [phone]  [facility]  [address] [place] [address] [phone]    Situation ongoing, CSW to continue following and update chart as more information becomes available.      Cathie Beams, Theresia Majors  04/01/2023 9:47 AM

## 2023-04-01 NOTE — Progress Notes (Addendum)
ADDENDUM  PT ACCEPTED TO [facility] TODAY     Pt was accepted to [facility] 04/02/2023. Bed assignment: Adult Unit  Pt meets inpatient criteria per Dahlia Byes, NP  Attending Physician will be Kevan Ny, MD  Report can be called to: [phone]  Pt can arrive after 8 AM  Care Team Notified: Dahlia Byes, NP, Pearlie Oyster, Paramedic, and [employer], NT  [place], [place]  04/01/2023 10:11 AM

## 2023-04-01 NOTE — ED Notes (Signed)
Pt asleep att. Will hold breakfast tray

## 2023-04-01 NOTE — Plan of Care (Signed)
Problem: Education: Goal: Knowledge of [facility] Education information/materials will improve Outcome: Progressing Goal: Emotional status will improve Outcome: Progressing Goal: Mental status will improve Outcome: Progressing Goal: Verbalization of understanding the information provided will improve Outcome: Progressing   Problem: Activity: Goal: Interest or engagement in activities will improve Outcome: Progressing Goal: Sleeping patterns will improve Outcome: Progressing   Problem: Coping: Goal: Ability to verbalize frustrations and anger appropriately will improve Outcome: Progressing Goal: Ability to demonstrate self-control will improve Outcome: Progressing   Problem: Health Behavior/Discharge Planning: Goal: Identification of resources available to assist in meeting health care needs will improve Outcome: Progressing Goal: Compliance with treatment plan for underlying cause of condition will improve Outcome: Progressing   Problem: Physical Regulation: Goal: Ability to maintain clinical measurements within normal limits will improve Outcome: Progressing   Problem: Safety: Goal: Periods of time without injury will increase Outcome: Progressing   Problem: Activity: Goal: Will verbalize the importance of balancing activity with adequate rest periods Outcome: Progressing   Problem: Education: Goal: Will be free of psychotic symptoms Outcome: Progressing Goal: Knowledge of the prescribed therapeutic regimen will improve Outcome: Progressing   Problem: Coping: Goal: Coping ability will improve Outcome: Progressing Goal: Will verbalize feelings Outcome: Progressing   Problem: Health Behavior/Discharge Planning: Goal: Compliance with prescribed medication regimen will improve Outcome: Progressing   Problem: Nutritional: Goal: Ability to achieve adequate nutritional intake will improve Outcome: Progressing   Problem: Role Relationship: Goal:  Ability to communicate needs accurately will improve Outcome: Progressing Goal: Ability to interact with others will improve Outcome: Progressing   Problem: Safety: Goal: Ability to redirect hostility and anger into socially appropriate behaviors will improve Outcome: Progressing Goal: Ability to remain free from injury will improve Outcome: Progressing   Problem: Self-Care: Goal: Ability to participate in self-care as condition permits will improve Outcome: Progressing   Problem: Self-Concept: Goal: Will verbalize positive feelings about self Outcome: Progressing   Problem: Education: Goal: Knowledge of [facility] Education information/materials will improve Outcome: Progressing Goal: Emotional status will improve Outcome: Progressing Goal: Mental status will improve Outcome: Progressing Goal: Verbalization of understanding the information provided will improve Outcome: Progressing   Problem: Activity: Goal: Interest or engagement in activities will improve Outcome: Progressing Goal: Sleeping patterns will improve Outcome: Progressing   Problem: Coping: Goal: Ability to verbalize frustrations and anger appropriately will improve Outcome: Progressing Goal: Ability to demonstrate self-control will improve Outcome: Progressing   Problem: Health Behavior/Discharge Planning: Goal: Identification of resources available to assist in meeting health care needs will improve Outcome: Progressing Goal: Compliance with treatment plan for underlying cause of condition will improve Outcome: Progressing   Problem: Physical Regulation: Goal: Ability to maintain clinical measurements within normal limits will improve Outcome: Progressing   Problem: Safety: Goal: Periods of time without injury will increase Outcome: Progressing   Problem: Activity: Goal: Will verbalize the importance of balancing activity with adequate rest periods Outcome: Progressing   Problem:  Education: Goal: Will be free of psychotic symptoms Outcome: Progressing Goal: Knowledge of the prescribed therapeutic regimen will improve Outcome: Progressing   Problem: Coping: Goal: Coping ability will improve Outcome: Progressing Goal: Will verbalize feelings Outcome: Progressing   Problem: Health Behavior/Discharge Planning: Goal: Compliance with prescribed medication regimen will improve Outcome: Progressing   Problem: Nutritional: Goal: Ability to achieve adequate nutritional intake will improve Outcome: Progressing   Problem: Role Relationship: Goal: Ability to communicate needs accurately will improve Outcome: Progressing Goal: Ability to interact with others will improve Outcome: Progressing  Problem: Safety: Goal: Ability to redirect hostility and anger into socially appropriate behaviors will improve Outcome: Progressing Goal: Ability to remain free from injury will improve Outcome: Progressing   Problem: Self-Care: Goal: Ability to participate in self-care as condition permits will improve Outcome: Progressing   Problem: Self-Concept: Goal: Will verbalize positive feelings about self Outcome: Progressing   Problem: Physical Regulation: Goal: Ability to maintain clinical measurements within normal limits will improve Outcome: Progressing   Problem: Safety: Goal: Periods of time without injury will increase Outcome: Progressing   Problem: Coping: Goal: Coping ability will improve Outcome: Progressing

## 2023-04-01 NOTE — ED Notes (Signed)
Called [facility] to [facility] to [facility]. 

## 2023-04-01 NOTE — ED Notes (Signed)
Pt awake. Pt at nursing station asking to use the phone. Informed pt phone calls are allowed 10am-8pm. Offered pt breakfast tray. Pt refused breakfast tray

## 2023-04-01 NOTE — Progress Notes (Signed)
   04/01/23 2200  Psych Admission Type (Psych Patients Only)  Admission Status Involuntary  Psychosocial Assessment  Patient Complaints Anxiety;Irritability  Eye Contact Brief  Facial Expression Anxious;Flat  Affect Flat;Anxious  Speech Slow  Interaction Cautious  Motor Activity Slow  Appearance/Hygiene Disheveled  Behavior Characteristics Anxious;Irritable  Mood Anxious;Irritable  Thought Process  Coherency Circumstantial  Content Preoccupation  Delusions Paranoid;Religious  Perception Hallucinations  Hallucination Auditory;Visual  Judgment Poor  Confusion Mild  Danger to Self  Current suicidal ideation? Denies  Agreement Not to Harm Self Yes  Description of Agreement verbal  Danger to Others  Danger to Others None reported or observed

## 2023-04-01 NOTE — ED Notes (Signed)
Pt making a phone call to her mother again. Pt aware of phone policy in [facility]

## 2023-04-01 NOTE — Progress Notes (Addendum)
Pt was accepted to [facility] TODAY 04/01/2023. Bed assignment: 501-1  Pt meets inpatient criteria per Dahlia Byes, NP  Attending Physician will be Phineas Inches, MD  Report can be called to: - Adult unit: [phone]  Pt can arrive after 1 PM  Care Team Notified: [facility], RN and Dahlia Byes, NP  [place], [place]  04/01/2023 10:59 AM

## 2023-04-01 NOTE — ED Notes (Signed)
Patient resting comfortably, no apparent distress.  

## 2023-04-01 NOTE — Group Note (Signed)
Date:  04/01/2023 Time:  9:04 PM  Group Topic/Focus:  Wrap-Up Group:   The focus of this group is to help patients review their daily goal of treatment and discuss progress on daily workbooks.    Participation Level:  Did Not Attend   Scot Dock 04/01/2023, 9:04 PM

## 2023-04-01 NOTE — ED Notes (Signed)
Offered pt breakfast tray. Pt refused breakfast tray

## 2023-04-01 NOTE — Progress Notes (Signed)
Patient admitted to facility, brought by [employer]. General appearance unremarkable. Patient very flat during assessment, with minimal eye contact. Verbalized that she had recently been hearing and seeing visions but would not elaborate. Informed that she refused to listen the voices anymore. Would not confirm/deny suicidal ideation at that time. Patient refused answer further questions, stating that she was tired of answering questions. Completed admission paperwork, personal items placed in the locker and at bedside. Upon later assessment patient did deny suicidal ideation. Verbalized that she would remain safe. Informed writer that she needed to get back home to her dog, who helps her know when the demons are present. She confirmed that she often see demons all over, and on television as a result, prefers to stay away from people. Patient oriented to the unit. Called mother who is her support, upon being settled to the unit.

## 2023-04-01 NOTE — ED Notes (Signed)
Provided patient with drink and snack and warm blanket.

## 2023-04-01 NOTE — ED Notes (Signed)
Pt making a phone call to her mother

## 2023-04-01 NOTE — Progress Notes (Signed)
Patient believes cell phone to be broken

## 2023-04-01 NOTE — ED Provider Notes (Signed)
Assumed care of 0 700.  Patient is admitted to [facility], DO 04/01/23 1512

## 2023-04-01 NOTE — ED Notes (Signed)
Called report to [employer] and she said we send patient after 1:00pm

## 2023-04-01 NOTE — Tx Team (Signed)
Initial Treatment Plan 04/01/2023 6:57 PM Sierra Ortiz:[id number]    PATIENT STRESSORS: Loss of apartment  Loss of apartment PATIENT STRENGTHS: Supportive family/friends    PATIENT IDENTIFIED PROBLEMS: Loss of apartment due to current stay                     DISCHARGE CRITERIA:  Adequate post-discharge living arrangements  PRELIMINARY DISCHARGE PLAN: Placement in alternative living arrangements  PATIENT/FAMILY INVOLVEMENT: This treatment plan has been presented to and reviewed with the patient, [name], and/or family member.  The patient and family have been given the opportunity to ask questions and make suggestions.  Mathews Argyle, RN 04/01/2023, 6:57 PM

## 2023-04-01 NOTE — ED Notes (Signed)
Patient approached window asking to use the phone when we explained phone calls are only allowed between 10am and 8pm. Patient start getting upset and agitated. Patient started ringing hands and pacing, saying " I need to get out of here" Patient asked for something to calm her down. Patient appeared agitated and is unsure of why she is here and keeps demanding to leave.

## 2023-04-02 ENCOUNTER — Encounter (HOSPITAL_COMMUNITY): Payer: Self-pay | Admitting: Nurse Practitioner

## 2023-04-02 ENCOUNTER — Encounter (HOSPITAL_COMMUNITY): Payer: Self-pay

## 2023-04-02 DIAGNOSIS — K219 Gastro-esophageal reflux disease without esophagitis: Secondary | ICD-10-CM | POA: Diagnosis present

## 2023-04-02 DIAGNOSIS — F17200 Nicotine dependence, unspecified, uncomplicated: Secondary | ICD-10-CM | POA: Diagnosis present

## 2023-04-02 DIAGNOSIS — F250 Schizoaffective disorder, bipolar type: Principal | ICD-10-CM

## 2023-04-02 DIAGNOSIS — F411 Generalized anxiety disorder: Secondary | ICD-10-CM | POA: Diagnosis present

## 2023-04-02 DIAGNOSIS — J302 Other seasonal allergic rhinitis: Secondary | ICD-10-CM | POA: Diagnosis present

## 2023-04-02 DIAGNOSIS — J45909 Unspecified asthma, uncomplicated: Secondary | ICD-10-CM | POA: Diagnosis present

## 2023-04-02 MED ORDER — HYDROXYZINE HCL 50 MG PO TABS
50.0000 mg | ORAL_TABLET | Freq: Three times a day (TID) | ORAL | Status: DC | PRN
  Administered 2023-04-02 – 2023-04-08 (×12): 50 mg via ORAL
  Filled 2023-04-02 (×12): qty 1

## 2023-04-02 MED ORDER — OLANZAPINE 10 MG IM SOLR: 10.0000 mg | Freq: Three times a day (TID) | INTRAMUSCULAR | Status: DC | PRN

## 2023-04-02 MED ORDER — OLANZAPINE 10 MG PO TBDP
10.0000 mg | ORAL_TABLET | Freq: Three times a day (TID) | ORAL | Status: DC | PRN
  Administered 2023-04-06: 10 mg via ORAL
  Filled 2023-04-02: qty 1

## 2023-04-02 MED ORDER — ACETAMINOPHEN 325 MG PO TABS
ORAL_TABLET | ORAL | Status: AC
  Filled 2023-04-02: qty 2

## 2023-04-02 MED ORDER — FLUTICASONE PROPIONATE 50 MCG/ACT NA SUSP
2.0000 | Freq: Every day | NASAL | Status: DC
  Administered 2023-04-03 – 2023-04-06 (×3): 2 via NASAL
  Filled 2023-04-02: qty 16

## 2023-04-02 MED ORDER — ACETAMINOPHEN 325 MG PO TABS
650.0000 mg | ORAL_TABLET | Freq: Four times a day (QID) | ORAL | Status: DC | PRN
  Administered 2023-04-02 – 2023-04-04 (×2): 650 mg via ORAL
  Filled 2023-04-02: qty 2

## 2023-04-02 MED ORDER — ALBUTEROL SULFATE HFA 108 (90 BASE) MCG/ACT IN AERS: 1.0000 | INHALATION_SPRAY | Freq: Four times a day (QID) | RESPIRATORY_TRACT | Status: DC | PRN

## 2023-04-02 NOTE — Progress Notes (Signed)
Pt refusing to submit sample for urine pregnancy test.

## 2023-04-02 NOTE — [facility] Suicide Risk Assessment (Cosign Needed Addendum)
Suicide Risk Assessment  Discharge Assessment    [facility] Admissions Suicide Risk Assessment   Principal Problem: Schizoaffective disorder, bipolar type Discharge Diagnoses: Principal Problem:   Schizoaffective disorder, bipolar type Active Problems:   Delta-9-tetrahydrocannabinol (THC) dependence   Generalized anxiety disorder   Tobacco dependence   GERD (gastroesophageal reflux disease)   Asthma   Seasonal allergic rhinitis  CC:"I was outside naked hearing voices.Marland KitchenMarland KitchenI was a week without my medications."    Reason For Admission: Sierra Ortiz is a 37 yo AAF with a past mental health diagnosis of schizoaffective d/o bipolar d/o who presented to the [facility] on 4/15 with [facility] after she was found outside naked screaming that she was seeing demons, beasts, and hearing rats in the walls. Pt continued to escalate in agitation while at the ER, requiring involuntary commitment prior to transportation to this [facility]   Continuous symptoms: Remains psychotic: Reporting auditory and visual hallucinations, is paranoid, reporting insomnia, poor insight and judgment, requires continuous hospitalization for treatment and stabilization of current mental status.  Total Time spent with patient: 1.5 hours  Musculoskeletal: Strength & Muscle Tone: within normal limits Gait & Station: normal Patient leans: N/A  Psychiatric Specialty Exam  Presentation  General Appearance:  Disheveled  Eye Contact: Fair  Speech: Clear and Coherent  Speech Volume: Normal  Handedness: Right   Mood and Affect  Mood: Anxious; Irritable; Angry; Depressed  Duration of Depression Symptoms: No data recorded Affect: Congruent   Thought Process  Thought Processes: Coherent  Descriptions of Associations:Intact  Orientation:Partial  Thought Content:Illogical  History of Schizophrenia/Schizoaffective disorder:No data recorded Duration of Psychotic Symptoms:No data  recorded Hallucinations:Hallucinations: Auditory; Visual  Ideas of Reference:Paranoia; Delusions  Suicidal Thoughts:Suicidal Thoughts: No  Homicidal Thoughts:Homicidal Thoughts: No   Sensorium  Memory: Immediate Good  Judgment: Poor  Insight: Poor   Executive Functions  Concentration: Poor  Attention Span: Poor  Recall: Poor  Fund of Knowledge: Poor  Language: Poor   Psychomotor Activity  Psychomotor Activity: Psychomotor Activity: Normal   Assets  Assets: Resilience   Sleep  Sleep: Sleep: Poor   Physical Exam: Physical Exam Constitutional:      Appearance: Normal appearance.  HENT:     Head: Normocephalic.     Nose: Nose normal.  Eyes:     Pupils: Pupils are equal, round, and reactive to light.  Pulmonary:     Effort: Pulmonary effort is normal.  Musculoskeletal:     Cervical back: Normal range of motion.  Neurological:     Mental Status: She is alert and oriented to person, place, and time.  Psychiatric:        Thought Content: Thought content normal.    Review of Systems  Constitutional:  Negative for fever.  HENT:  Negative for hearing loss.   Eyes:  Negative for blurred vision.  Respiratory:  Negative for cough.   Cardiovascular:  Negative for chest pain.  Gastrointestinal:  Negative for heartburn.  Genitourinary:  Negative for dysuria.  Musculoskeletal:  Negative for myalgias.  Skin:  Negative for rash.  Neurological:  Negative for dizziness.  Psychiatric/Behavioral:  Positive for depression, hallucinations and substance abuse. Negative for memory loss and suicidal ideas. The patient is nervous/anxious and has insomnia.    Blood pressure 101/76, pulse 99, temperature 98.1 F (36.7 C), temperature source Oral, resp. rate 16, height  (1.575 m), weight 48.3 kg, SpO2 98 %. Body mass index is 19.46 kg/m.  Mental Status Per Nursing Assessment::   On Admission:  NA  Demographic Factors:  NA  Loss  Factors: NA  Historical Factors: Family history of mental illness or substance abuse and Impulsivity  Risk Reduction Factors:   Living with another person, especially a relative and Positive social support  Continued Clinical Symptoms:  Alcohol/Substance Abuse/Dependencies More than one psychiatric diagnosis Currently Psychotic  Cognitive Features That Contribute To Risk:  None    Suicide Risk:  Mild:  There are no identifiable suicide plans, no associated intent, mild dysphoria and related symptoms, good self-control (both objective and subjective assessment), few other risk factors, and identifiable protective factors, including available and accessible social support.   Plan Of Care: Please See H & P  Other:  Please See H & P  Starleen Blue, NP 04/02/2023, 7:36 PM

## 2023-04-02 NOTE — BH IP Treatment Plan (Signed)
Interdisciplinary Treatment and Diagnostic Plan Update  04/02/2023 Time of Session: 11:00 AM  Sierra Ortiz MRN: [id number]  Principal Diagnosis: Bipolar affective disorder, currently manic, severe, with psychotic features  Secondary Diagnoses: Principal Problem:   Bipolar affective disorder, currently manic, severe, with psychotic features   Current Medications:  Current Facility-Administered Medications  Medication Dose Route Frequency Provider Last Rate Last Admin   acetaminophen (TYLENOL) tablet 650 mg  650 mg Oral Q6H PRN Sindy Guadeloupe, NP   650 mg at 04/02/23 0640   alum & mag hydroxide-simeth (MAALOX/MYLANTA) 200-200-20 MG/5ML suspension 30 mL  30 mL Oral Q4H PRN Earney Navy, NP       hydrOXYzine (ATARAX) tablet 50 mg  50 mg Oral TID PRN Massengill, Harrold Donath, MD       LORazepam (ATIVAN) tablet 2 mg  2 mg Oral Q6H PRN Abbott Pao, Nadir, MD   2 mg at 04/02/23 7829   magnesium hydroxide (MILK OF MAGNESIA) suspension 30 mL  30 mL Oral Daily PRN Earney Navy, NP       OLANZapine zydis (ZYPREXA) disintegrating tablet 10 mg  10 mg Oral TID PRN Phineas Inches, MD       Or   OLANZapine (ZYPREXA) injection 10 mg  10 mg Intramuscular TID PRN Massengill, Harrold Donath, MD       pantoprazole (PROTONIX) EC tablet 40 mg  40 mg Oral Daily Onuoha, Josephine C, NP   40 mg at 04/02/23 5621   risperiDONE (RISPERDAL M-TABS) disintegrating tablet 1 mg  1 mg Oral BID Abbott Pao, Nadir, MD   1 mg at 04/02/23 0808   traZODone (DESYREL) tablet 50 mg  50 mg Oral QHS Dahlia Byes C, NP   50 mg at 04/01/23 2037   PTA Medications: No medications prior to admission.    Patient Stressors:    Patient Strengths: Supportive family/friends   Treatment Modalities: Medication Management, Group therapy, Case management,  1 to 1 session with clinician, Psychoeducation, Recreational therapy.   Physician Treatment Plan for Primary Diagnosis: Bipolar affective disorder, currently manic, severe, with  psychotic features Long Term Goal(s):     Short Term Goals:    Medication Management: Evaluate patient's response, side effects, and tolerance of medication regimen.  Therapeutic Interventions: 1 to 1 sessions, Unit Group sessions and Medication administration.  Evaluation of Outcomes: Not Progressing  Physician Treatment Plan for Secondary Diagnosis: Principal Problem:   Bipolar affective disorder, currently manic, severe, with psychotic features  Long Term Goal(s):     Short Term Goals:       Medication Management: Evaluate patient's response, side effects, and tolerance of medication regimen.  Therapeutic Interventions: 1 to 1 sessions, Unit Group sessions and Medication administration.  Evaluation of Outcomes: Not Progressing   RN Treatment Plan for Primary Diagnosis: Bipolar affective disorder, currently manic, severe, with psychotic features Long Term Goal(s): Knowledge of disease and therapeutic regimen to maintain health will improve  Short Term Goals: Ability to remain free from injury will improve, Ability to verbalize frustration and anger appropriately will improve, Ability to demonstrate self-control, Ability to participate in decision making will improve, Ability to verbalize feelings will improve, Ability to disclose and discuss suicidal ideas, Ability to identify and develop effective coping behaviors will improve, and Compliance with prescribed medications will improve  Medication Management: RN will administer medications as ordered by provider, will assess and evaluate patient's response and provide education to patient for prescribed medication. RN will report any adverse and/or side effects to prescribing provider.  Therapeutic Interventions:  1 on 1 counseling sessions, Psychoeducation, Medication administration, Evaluate responses to treatment, Monitor vital signs and CBGs as ordered, Perform/monitor CIWA, COWS, AIMS and Fall Risk screenings as ordered, Perform  wound care treatments as ordered.  Evaluation of Outcomes: Not Progressing   LCSW Treatment Plan for Primary Diagnosis: Bipolar affective disorder, currently manic, severe, with psychotic features Long Term Goal(s): Safe transition to appropriate next level of care at discharge, Engage patient in therapeutic group addressing interpersonal concerns.  Short Term Goals: Engage patient in aftercare planning with referrals and resources, Increase social support, Increase ability to appropriately verbalize feelings, Increase emotional regulation, Facilitate acceptance of mental health diagnosis and concerns, Facilitate patient progression through stages of change regarding substance use diagnoses and concerns, Identify triggers associated with mental health/substance abuse issues, and Increase skills for wellness and recovery  Therapeutic Interventions: Assess for all discharge needs, 1 to 1 time with Social worker, Explore available resources and support systems, Assess for adequacy in community support network, Educate family and significant other(s) on suicide prevention, Complete Psychosocial Assessment, Interpersonal group therapy.  Evaluation of Outcomes: Not Progressing   Progress in Treatment: Attending groups: No. Participating in groups: No. Taking medication as prescribed: Yes. Toleration medication: Yes. Family/Significant other contact made: No, will contact:  whoever patient give consent for CSW to contact Patient understands diagnosis: No. Discussing patient identified problems/goals with staff: No. Medical problems stabilized or resolved: Yes. Denies suicidal/homicidal ideation: Yes. Issues/concerns per patient self-inventory: No.   New problem(s) identified: No, Describe:  None reported  New Short Term/Long Term Goal(s):medication stabilization, elimination of SI thoughts, development of comprehensive mental wellness plan.    Patient Goals:  Did not want to wake up to  attend, called patient name 4 times and knocked and patient did not open eyes; pt was breathing though.   Discharge Plan or Barriers: Patient recently admitted. CSW will continue to follow and assess for appropriate referrals and possible discharge planning.    Reason for Continuation of Hospitalization: Aggression Anxiety Depression Mania Medication stabilization  Estimated Length of Stay: 5-7 days   Last 3 [place] Suicide Severity Risk Score: Flowsheet Row Ad[facility] (Current) [facility]/2023 in [facility] from 02/11/2023 in [facility] at [facility]  C-SSRS RISK CATEGORY Low Risk No Risk No Risk       Last PHQ 2/9 Scores:     No data to display          Scribe for Treatment Team: Isabella Bowens, LCSWA 04/02/2023 12:00 PM

## 2023-04-02 NOTE — Group Note (Signed)
Recreation Therapy Group Note   Group Topic:Personal Development  Group Date: 04/02/2023 Start Time: 1011 End Time: 1051 Facilitators: Jowana Thumma-McCall, LRT,CTRS Location: [address] Dayroom   Goal Area(s) Addresses:  Patient will successfully identify positive attributes about themselves.  Patient will identify healthy ways to increase personal development. Patient will acknowledge benefit(s) of developing their character.   Group Description:  [place].  LRT and patients played a game in the same style as [place].  Except in this game, each block was a different color (red, orange, green and blue).  Each colored corresponds with a question.  Which ever color patient picked, they were asked a question that matched the color.    Affect/Mood: N/A   Participation Level: Did not attend    Clinical Observations/Individualized Feedback:     Plan: Continue to engage patient in RT group sessions 2-3x/week.   Sierra Ortiz, LRT,CTRS 04/02/2023 1:31 PM

## 2023-04-02 NOTE — [facility] Suicide Risk Assessment (Signed)
[facility] INPATIENT:  Family/Significant Other Suicide Prevention Education  Suicide Prevention Education:  Patient Refusal for Family/Significant Other Suicide Prevention Education: The patient [name] has refused to provide written consent for family/significant other to be provided Family/Significant Other Suicide Prevention Education during admission and/or prior to discharge.  Physician notified.  Corky Crafts 04/02/2023, 3:30 PM
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
## 2023-04-02 NOTE — [facility] Group Notes (Signed)
Pt was encouraged but refused to attend group discussion  
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
## 2023-04-02 NOTE — Progress Notes (Signed)
Pt this morning noted with irritable affect, yelling at staff. Pt medication compliant. Pt received ativan and Geodon prn for agitation. Pt this afternoon received hydroxyzine for anxiety. Pt in room stating she went to enough groups [facility] facility that she is refusing them now. Nurse attempts to educate pt unsuccessful. Q 15 minute checks ongoing for safety.

## 2023-04-02 NOTE — Progress Notes (Signed)
   04/02/23 0551  15 Minute Checks  Location Bedroom  Visual Appearance Calm  Behavior Sleeping  Sleep (Behavioral Health Patients Only)  Calculate sleep? (Click Yes once per 24 hr at 0600 safety check) Yes  Documented sleep last 24 hours 8

## 2023-04-02 NOTE — Group Note (Unsigned)
Date:  04/02/2023 Time:  9:19 AM  Group Topic/Focus:  Goals Group:   The focus of this group is to help patients establish daily goals to achieve during treatment and discuss how the patient can incorporate goal setting into their daily lives to aide in recovery. Orientation:   The focus of this group is to educate the patient on the purpose and policies of crisis stabilization and provide a format to answer questions about their admission.  The group details unit policies and expectations of patients while admitted.     Participation Level:  {BHH PARTICIPATION LEVEL:22264}  Participation Quality:  {BHH PARTICIPATION QUALITY:22265}  Affect:  {BHH AFFECT:22266}  Cognitive:  {BHH COGNITIVE:22267}  Insight: {BHH Insight2:20797}  Engagement in Group:  {BHH ENGAGEMENT IN GROUP:22268}  Modes of Intervention:  {BHH MODES OF INTERVENTION:22269}  Additional Comments:  ***  Sierra Ortiz 04/02/2023, 9:19 AM  

## 2023-04-02 NOTE — Plan of Care (Signed)
  Problem: Education: Goal: Knowledge of [facility] Education information/materials will improve Outcome: Progressing   Problem: Education: Goal: Knowledge of the prescribed therapeutic regimen will improve Outcome: Progressing   Problem: Health Behavior/Discharge Planning: Goal: Compliance with prescribed medication regimen will improve Outcome: Progressing   Problem: Safety: Goal: Ability to redirect hostility and anger into socially appropriate behaviors will improve Outcome: Progressing   Problem: Self-Concept: Goal: Will verbalize positive feelings about self Outcome: Progressing

## 2023-04-03 LAB — TSH: TSH: 1.324 u[IU]/mL (ref 0.350–4.500)

## 2023-04-03 LAB — HEMOGLOBIN A1C
Hgb A1c MFr Bld: 4.9 % (ref 4.8–5.6)
Mean Plasma Glucose: 93.93 mg/dL

## 2023-04-03 LAB — VITAMIN D 25 HYDROXY (VIT D DEFICIENCY, FRACTURES): Vit D, 25-Hydroxy: 15.19 ng/mL — ABNORMAL LOW (ref 30–100)

## 2023-04-03 LAB — LIPID PANEL
Cholesterol: 170 mg/dL (ref 0–200)
HDL: 43 mg/dL (ref >40)
LDL Cholesterol: 113 mg/dL — ABNORMAL HIGH (ref 0–99)
Total CHOL/HDL Ratio: 4 RATIO
Triglycerides: 72 mg/dL (ref <150)
VLDL: 14 mg/dL (ref 0–40)

## 2023-04-03 LAB — HCG, QUANTITATIVE, PREGNANCY: hCG, Beta Chain, Quant, S: 1 m[IU]/mL (ref <5)

## 2023-04-03 MED ORDER — VITAMIN D (ERGOCALCIFEROL) 1.25 MG (50000 UNIT) PO CAPS
50000.0000 [IU] | ORAL_CAPSULE | ORAL | Status: DC
  Administered 2023-04-03: 50000 [IU] via ORAL
  Filled 2023-04-03 (×2): qty 1

## 2023-04-03 MED ORDER — RISPERIDONE 2 MG PO TBDP
2.0000 mg | ORAL_TABLET | Freq: Every day | ORAL | Status: DC
  Administered 2023-04-03 – 2023-04-04 (×2): 2 mg via ORAL
  Filled 2023-04-03 (×4): qty 1

## 2023-04-03 MED ORDER — NICOTINE 21 MG/24HR TD PT24
21.0000 mg | MEDICATED_PATCH | Freq: Every day | TRANSDERMAL | Status: DC
  Administered 2023-04-03 – 2023-04-05 (×3): 21 mg via TRANSDERMAL
  Filled 2023-04-03 (×5): qty 1

## 2023-04-03 MED ORDER — NICOTINE POLACRILEX 2 MG MT GUM
CHEWING_GUM | OROMUCOSAL | Status: AC
  Administered 2023-04-03: 2 mg
  Filled 2023-04-03: qty 1

## 2023-04-03 MED ORDER — RISPERIDONE 1 MG PO TBDP
1.0000 mg | ORAL_TABLET | Freq: Every day | ORAL | Status: DC
  Administered 2023-04-04 – 2023-04-05 (×2): 1 mg via ORAL
  Filled 2023-04-03 (×4): qty 1

## 2023-04-03 MED ORDER — NICOTINE POLACRILEX 2 MG MT GUM
2.0000 mg | CHEWING_GUM | OROMUCOSAL | Status: DC | PRN
  Administered 2023-04-03 – 2023-04-07 (×8): 2 mg via ORAL
  Filled 2023-04-03 (×3): qty 1

## 2023-04-03 NOTE — Progress Notes (Signed)
   04/03/23 2200  Psych Admission Type (Psych Patients Only)  Admission Status Involuntary  Psychosocial Assessment  Patient Complaints Anxiety  Eye Contact Fair  Facial Expression Anxious  Affect Irritable  Speech Logical/coherent  Interaction Assertive  Motor Activity Slow  Appearance/Hygiene Unremarkable  Behavior Characteristics Irritable  Mood Irritable  Thought Process  Coherency WDL  Content Blaming others  Delusions Paranoid  Perception WDL  Hallucination None reported or observed  Judgment Poor  Confusion None  Danger to Self  Current suicidal ideation? Denies  Agreement Not to Harm Self Yes  Description of Agreement verbal  Danger to Others  Danger to Others None reported or observed

## 2023-04-03 NOTE — Group Note (Signed)
Date:  04/03/2023 Time:  6:53 PM  Group Topic/Focus:  Goals Group:   The focus of this group is to help patients establish daily goals to achieve during treatment and discuss how the patient can incorporate goal setting into their daily lives to aide in recovery. Orientation:   The focus of this group is to educate the patient on the purpose and policies of crisis stabilization and provide a format to answer questions about their admission.  The group details unit policies and expectations of patients while admitted.    Participation Level:  Did Not Attend  Participation Quality:   n/a  Affect:   n/a  Cognitive:   n/a  Insight: None  Engagement in Group:   n/a  Modes of Intervention:   n/a  Additional Comments:   Pt did not attend.  Edmund Hilda Loralee Weitzman 04/03/2023, 6:53 PM

## 2023-04-03 NOTE — Progress Notes (Signed)
Recreation Therapy Notes  INPATIENT RECREATION THERAPY ASSESSMENT  Patient Details Name: Sierra Ortiz MRN: [id number] DOB: 1986/04/27 Today's Date: 04/03/2023       Information Obtained From: Patient  Able to Participate in Assessment/Interview: Yes  Patient Presentation: Responsive  Reason for Admission (Per Patient): Other (Comments) ("stuff")  Patient Stressors: Other (Comment) (None identified)  Coping Skills:   Isolation, Journal  Leisure Interests (2+):   ("nothing")  Frequency of Recreation/Participation:  (N/A)  Awareness of Community Resources:  Yes  Community Resources:  Tree surgeon, Public affairs consultant  Current Use: No  If no, Barriers?:  (Did not answer)  Expressed Interest in [employer] Information:  (Did not answer)  [place] of Residence:  [place]  Patient Main Form of Transportation:  (N/A)  Patient Strengths:  "I don't know"  Patient Identified Areas of Improvement:  "I don't know"  Patient Goal for Hospitalization:  "go home"  Current SI (including self-harm):  No  Current HI:  No  Current AVH: No  Staff Intervention Plan: Group Attendance, Collaborate with Interdisciplinary Treatment Team  Consent to Intern Participation: N/A   Shalise Rosado-McCall, LRT,CTRS Sierra Ortiz 04/03/2023, 1:01 PM

## 2023-04-03 NOTE — Progress Notes (Signed)
   04/03/23 0601  15 Minute Checks  Location Bedroom  Visual Appearance Calm  Behavior Sleeping  Sleep (Behavioral Health Patients Only)  Calculate sleep? (Click Yes once per 24 hr at 0600 safety check) Yes  Documented sleep last 24 hours 12

## 2023-04-03 NOTE — Progress Notes (Signed)
[facility] MD Progress Note  04/03/2023 3:59 PM Sierra Ortiz  MRN:  [id number] Principal Problem: Schizoaffective disorder, bipolar type Diagnosis: Principal Problem:   Schizoaffective disorder, bipolar type Active Problems:   Delta-9-tetrahydrocannabinol (THC) dependence   Generalized anxiety disorder   Tobacco dependence   GERD (gastroesophageal reflux disease)   Asthma   Seasonal allergic rhinitis  Reason For Admission: Sierra Ortiz is a 37 yo AAF with a past mental health diagnosis of schizoaffective d/o bipolar d/o who presented to the [facility] ER on 4/15 with GPD after she was found outside naked screaming that she was seeing demons, beasts, and hearing rats in the walls. Pt continued to escalate in agitation while at the ER, requiring involuntary commitment prior to transportation to this [facility].   24 hour chart review: Sleep Hours last night: 12 hours  Nursing Concerns:  Behavioral episodes in the past 24 hrs:As per nursing documentation: "Patient demanding to see a Dr Modena Jansky, or she will go home, "I want to leave" Irritable, anxious, volatile, verbally abusive and argumentative interacting with this Clinical research associate during assessment. Received PRN Ativan 2 mg PO for agitation, hydroxyzine 50 mg for anxiety, along with her scheduled medications without any side effects reported. "  Medication Compliance: Compliant  Vital Signs in the past 24 hrs: HR with some periods of elevation earlier today morning. PRN Medications in the past 24 hrs: Agitation protocol medications as in above excerpt from nursing documentation.  Patient assessment note: Pt continues to present today with a flat affect and depressed mood, attention to personal hygiene and grooming remains poor, and she appears disheveled and unkempt. Pt's eye contact is fair, speech is clear & coherent. Thought contents are organized and logical, and pt currently denies SI/HI/AVH or paranoia. There is no evidence of delusional  thoughts. She reports a good sleep quality last night and reports that her appetite is fair.  Patient is continues to exhibit poor insight into her current mental status, and demanding to be discharged so that she can go home and use marijuana because it is the only thing that helps her mood. She states: "All I need is my weed. It keeps my mood calm and focus. I can eat and sleep. Life is great when I have my marijuana and my my medicine, and I was out of both before I came here." Pt educated that the marijuana can trigger her psychosis, but is non receptive to the education. We will continue tom reinforce the need for pt to tend to personal hygiene needs, and will most likely implement a room lock out as she was non receptive to getting out of her room to go to the day room today for unit group sessions.   We are increasing Risperdal to 2 mg nightly and 1 mg in the mornings. We are holding off Depakote for now, and continuing other medications as below.  Total Time spent with patient: 45 minutes  Past Psychiatric History: See H & P  Past Medical History:  Past Medical History:  Diagnosis Date   Asthma    Gastric ulcer    IBS (irritable bowel syndrome)     Past Surgical History:  Procedure Laterality Date   ABDOMINAL HYSTERECTOMY     partial    Family History: History reviewed. No pertinent family history. Family Psychiatric  History: See H & P Social History:  Social History   Substance and Sexual Activity  Alcohol Use Yes   Comment: rarely     Social History  Substance and Sexual Activity  Drug Use Yes   Types: Marijuana   Comment: daily cannabis use    Social History   Socioeconomic History   Marital status: Single    Spouse name: Not on file   Number of children: Not on file   Years of education: Not on file   Highest education level: Not on file  Occupational History   Not on file  Tobacco Use   Smoking status: Some Days    Packs/day: .2    Types: Cigarettes    Smokeless tobacco: Never  Vaping Use   Vaping Use: Never used  Substance and Sexual Activity   Alcohol use: Yes    Comment: rarely   Drug use: Yes    Types: Marijuana    Comment: daily cannabis use   Sexual activity: Not Currently  Other Topics Concern   Not on file  Social History Narrative   Not on file   Social Determinants of Health   Financial Resource Strain: Not on file  Food Insecurity: Not on file  Transportation Needs: Patient Declined (04/01/2023)   PRAPARE - Administrator, Civil Service (Medical): Patient declined    Lack of Transportation (Non-Medical): Patient declined  Physical Activity: Not on file  Stress: Not on file  Social Connections: Not on file   Sleep: Good  Appetite:  Good  Current Medications: Current Facility-Administered Medications  Medication Dose Route Frequency Provider Last Rate Last Admin   acetaminophen (TYLENOL) tablet 650 mg  650 mg Oral Q6H PRN Sindy Guadeloupe, NP   650 mg at 04/02/23 0640   albuterol (VENTOLIN HFA) 108 (90 Base) MCG/ACT inhaler 1-2 puff  1-2 puff Inhalation Q6H PRN Massengill, Harrold Donath, MD       alum & mag hydroxide-simeth (MAALOX/MYLANTA) 200-200-20 MG/5ML suspension 30 mL  30 mL Oral Q4H PRN Welford Roche, Josephine C, NP       fluticasone (FLONASE) 50 MCG/ACT nasal spray 2 spray  2 spray Each Nare Daily Starleen Blue, NP   2 spray at 04/03/23 6440   hydrOXYzine (ATARAX) tablet 50 mg  50 mg Oral TID PRN Phineas Inches, MD   50 mg at 04/03/23 1304   LORazepam (ATIVAN) tablet 2 mg  2 mg Oral Q6H PRN Sarita Bottom, MD   2 mg at 04/02/23 2041   magnesium hydroxide (MILK OF MAGNESIA) suspension 30 mL  30 mL Oral Daily PRN Dahlia Byes C, NP       nicotine (NICODERM CQ - dosed in mg/24 hours) patch 21 mg  21 mg Transdermal Daily Attiah, Nadir, MD   21 mg at 04/03/23 1012   OLANZapine zydis (ZYPREXA) disintegrating tablet 10 mg  10 mg Oral TID PRN Massengill, Harrold Donath, MD       Or   OLANZapine (ZYPREXA)  injection 10 mg  10 mg Intramuscular TID PRN Massengill, Harrold Donath, MD       pantoprazole (PROTONIX) EC tablet 40 mg  40 mg Oral Daily Onuoha, Josephine C, NP   40 mg at 04/03/23 0838   [START ON 04/04/2023] risperiDONE (RISPERDAL M-TABS) disintegrating tablet 1 mg  1 mg Oral Daily Massengill, Nathan, MD       risperiDONE (RISPERDAL M-TABS) disintegrating tablet 2 mg  2 mg Oral QHS Massengill, Nathan, MD       traZODone (DESYREL) tablet 50 mg  50 mg Oral QHS Onuoha, Josephine C, NP   50 mg at 04/02/23 2040   Vitamin D (Ergocalciferol) (DRISDOL) 1.25 MG (50000 UNIT) capsule 50,000  Units  50,000 Units Oral Q7 days Starleen Blue, NP        Lab Results:  Results for orders placed or performed during the hospital encounter of 04/01/23 (from the past 48 hour(s))  TSH     Status: None   Collection Time: 04/03/23  6:27 AM  Result Value Ref Range   TSH 1.324 0.350 - 4.500 uIU/mL    Comment: Performed by a 3rd Generation assay with a functional sensitivity of <=0.01 uIU/mL. Performed at [facility], [address] [place], [place] [address]   Hemoglobin A1c     Status: None   Collection Time: 04/03/23  6:27 AM  Result Value Ref Range   Hgb A1c MFr Bld 4.9 4.8 - 5.6 %    Comment: (NOTE) Pre diabetes:          5.7%-6.4%  Diabetes:              >6.4%  Glycemic control for   <7.0% adults with diabetes    Mean Plasma Glucose 93.93 mg/dL    Comment: Performed at [facility], [address], [place], [place] [address]  Lipid panel     Status: Abnormal   Collection Time: 04/03/23  6:27 AM  Result Value Ref Range   Cholesterol 170 0 - 200 mg/dL   Triglycerides 72 <952 mg/dL   HDL 43 >84 mg/dL   Total CHOL/HDL Ratio 4.0 RATIO   VLDL 14 0 - 40 mg/dL   LDL Cholesterol 132 (H) 0 - 99 mg/dL    Comment:        Total Cholesterol/HDL:CHD Risk Coronary Heart Disease Risk Table                     Men   Women  1/2 Average Risk   3.4   3.3  Average Risk       5.0   4.4  2 X  Average Risk   9.6   7.1  3 X Average Risk  23.4   11.0        Use the calculated Patient Ratio above and the CHD Risk Table to determine the patient's CHD Risk.        ATP III CLASSIFICATION (LDL):  <100     mg/dL   Optimal  440-102  mg/dL   Near or Above                    Optimal  130-159  mg/dL   Borderline  725-366  mg/dL   High  >440     mg/dL   Very High Performed at [facility], [address] [place], [place] [address]   hCG, quantitative, pregnancy     Status: None   Collection Time: 04/03/23  6:27 AM  Result Value Ref Range   hCG, Beta Chain, Quant, S 1 <5 mIU/mL    Comment:          GEST. AGE      CONC.  (mIU/mL)   <=1 WEEK        5 - 50     2 WEEKS       50 - 500     3 WEEKS       100 - 10,000     4 WEEKS     1,000 - 30,000     5 WEEKS     3,500 - 115,000   6-8 WEEKS  12,000 - 270,000    12 WEEKS     15,000 - 220,000        FEMALE AND NON-PREGNANT FEMALE:     LESS THAN 5 mIU/mL Performed at [facility], [address] [place], [place] [address]   VITAMIN D 25 Hydroxy (Vit-D Deficiency, Fractures)     Status: Abnormal   Collection Time: 04/03/23  6:27 AM  Result Value Ref Range   Vit D, 25-Hydroxy 15.19 (L) 30 - 100 ng/mL    Comment: (NOTE) Vitamin D deficiency has been defined by the Institute of Medicine  and an Endocrine Society practice guideline as a level of serum 25-OH  vitamin D less than 20 ng/mL (1,2). The Endocrine Society went on to  further define vitamin D insufficiency as a level between 21 and 29  ng/mL (2).  1. IOM (Institute of Medicine). 2010. Dietary reference intakes for  calcium and D. [place]: [employer]. 2. Holick MF, Binkley [place], Bischoff-Ferrari HA, et al. Evaluation,  treatment, and prevention of vitamin D deficiency: an Endocrine  Society clinical practice guideline, JCEM. 2011 Jul; 96(7): 1911-30.  Performed at [facility], [address]  [place], [place] [address]     Blood Alcohol level:  Lab Results  Component Value Date   ETH <10 03/31/2023   ETH <10 04/22/2021    Metabolic Disorder Labs: Lab Results  Component Value Date   HGBA1C 4.9 04/03/2023   MPG 93.93 04/03/2023   MPG 103 05/12/2021   No results found for: "PROLACTIN" Lab Results  Component Value Date   CHOL 170 04/03/2023   TRIG 72 04/03/2023   HDL 43 04/03/2023   CHOLHDL 4.0 04/03/2023   VLDL 14 04/03/2023   LDLCALC 113 (H) 04/03/2023   LDLCALC 138 (H) 05/12/2021    Physical Findings: AIMS:  , ,  ,  ,    CIWA:    COWS:     Musculoskeletal: Strength & Muscle Tone: within normal limits Gait & Station: normal Patient leans: N/A  Psychiatric Specialty Exam:  Presentation  General Appearance:  Appropriate for Environment; Fairly Groomed  Eye Contact: Good  Speech: Clear and Coherent  Speech Volume: Normal  Handedness: Right   Mood and Affect  Mood: Anxious; Irritable; Angry; Depressed  Affect: Congruent   Thought Process  Thought Processes: Coherent  Descriptions of Associations:Intact  Orientation:Full (Time, Place and Person)  Thought Content:Logical  History of Schizophrenia/Schizoaffective disorder:No data recorded Duration of Psychotic Symptoms:No data recorded Hallucinations:Hallucinations: None  Ideas of Reference:None  Suicidal Thoughts:Suicidal Thoughts: No  Homicidal Thoughts:Homicidal Thoughts: No   Sensorium  Memory: Remote Poor  Judgment: Poor  Insight: Poor   Executive Functions  Concentration: Poor  Attention Span: Poor  Recall: Poor  Fund of Knowledge: Poor  Language: Poor  Psychomotor Activity  Psychomotor Activity: Psychomotor Activity: Normal  Assets  Assets: Housing  Sleep  Sleep: Sleep: Good  Physical Exam: Physical Exam Constitutional:      Appearance: Normal appearance.  HENT:     Head: Normocephalic.     Nose: Nose normal. No congestion.   Eyes:     Pupils: Pupils are equal, round, and reactive to light.  Pulmonary:     Effort: Pulmonary effort is normal.  Musculoskeletal:        General: Normal range of motion.     Cervical back: Normal range of motion.  Neurological:     Mental Status: She is alert and oriented to person, place,  and time.  Psychiatric:        Behavior: Behavior normal.        Thought Content: Thought content normal.    Review of Systems  Constitutional:  Negative for fever.  HENT:  Negative for hearing loss.   Eyes:  Negative for blurred vision.  Respiratory:  Negative for cough.   Cardiovascular:  Negative for chest pain.  Gastrointestinal:  Negative for heartburn.  Genitourinary:  Negative for dysuria.  Musculoskeletal:  Negative for myalgias.  Skin:  Negative for rash.  Neurological:  Negative for dizziness.  Psychiatric/Behavioral:  Positive for depression and substance abuse. Negative for hallucinations, memory loss and suicidal ideas. The patient is nervous/anxious and has insomnia.    Blood pressure 106/66, pulse 88, temperature 98.7 F (37.1 C), temperature source Oral, resp. rate 16, height  (1.575 m), weight 48.3 kg, SpO2 100 %. Body mass index is 19.46 kg/m.  Treatment Plan Summary: Treatment Plan Summary: Daily contact with patient to assess and evaluate symptoms and progress in treatment and Medication management   Observation Level/Precautions:  15 minute checks  Laboratory:  Labs reviewed   Psychotherapy:  Unit Group sessions  Medications:  See [facility]  Consultations:  To be determined   Discharge Concerns:  Safety, medication compliance, mood stability  Estimated LOS: 5-7 days  Other:  N/A    PLAN Safety and Monitoring: Voluntary admission to inpatient psychiatric unit for safety, stabilization and treatment Daily contact with patient to assess and evaluate symptoms and progress in treatment Patient's case to be discussed in multi-disciplinary team  meeting Observation Level : q15 minute checks Vital signs: q12 hours Precautions: Safety   Long Term Goal(s): Improvement in symptoms so as ready for discharge   Short Term Goals: Ability to identify changes in lifestyle to reduce recurrence of condition will improve, Ability to verbalize feelings will improve, Ability to disclose and discuss suicidal ideas, Ability to demonstrate self-control will improve, Ability to identify and develop effective coping behaviors will improve, Ability to maintain clinical measurements within normal limits will improve, and Ability to identify triggers associated with substance abuse/mental health issues will improve   Diagnoses Principal Problem:   Schizoaffective disorder, bipolar type Active Problems:   Delta-9-tetrahydrocannabinol (THC) dependence   Generalized anxiety disorder   Tobacco dependence   GERD (gastroesophageal reflux disease)   Asthma   Seasonal allergic rhinitis   Medications -Start Risperdal 1 mg in the mornings and start 2 mg nightly for mood stabilization (home med) -Start Vitamin D 56213 units weekly for Vit D deficiency -Holding off on Depakote at this time until after pregnancy test results -Continue Trazodone 50 mg nightly for sleep -Continue Ativan 2 mg as needed every 6 hours for agitation -Continue hydroxyzine 50 mg 3 times daily as needed for anxiety -Continue albuterol 1 to 2 puffs every 6 hours as needed for shortness of breath or wheezing  -Continue Protonix 40 mg daily for GERD -Continue Zyprexa 10 mg im/po for agitation tid prn   Other PRNS -Continue Tylenol 650 mg every 6 hours PRN for mild pain -Continue Maalox 30 mg every 4 hrs PRN for indigestion -Continue Imodium 2-4 mg as needed for diarrhea -Continue Milk of Magnesia as needed every 6 hrs for constipation -Continue Zofran disintegrating tabs every 6 hrs PRN for nausea    ANTIPSYCHOTIC CONSENT : We discussed the risks, benefits, side effects, and  alternatives to THE PRESCRIBED ANTIPSYCHOTIC, including but not limited to, the risk of fatigue, sedation, metabolic syndrome, weight gain,  movement abnormalities such as tremor & cogwheeling & tardive dyskinesia, temperature sensitivity, photosensitivity, blood pressure changes, heart rhythm effects, potential for medication interactions, and to not take these medications with alcohol or illicit drugs; informed consent was obtained. We discussed the necessity for routine monitoring including rating scales of abnormal movements, blood work, and ekgs, while the patient is prescribed antipsychotic medication.    Discharge Planning: Social work and case management to assist with discharge planning and identification of hospital follow-up needs prior to discharge Estimated LOS: 5-7 days Discharge Concerns: Need to establish a safety plan; Medication compliance and effectiveness Discharge Goals: Return home with outpatient referrals for mental health follow-up including medication management/psychotherapy   I certify that inpatient services furnished can reasonably be expected to improve the patient's condition.    Starleen Blue, NP 04/03/2023, 3:59 PM

## 2023-04-03 NOTE — Group Note (Signed)
Date:  04/03/2023 Time:  8:53 PM  Group Topic/Focus:  Wrap-Up Group:   The focus of this group is to help patients review their daily goal of treatment and discuss progress on daily workbooks.    Participation Level:  Active  Participation Quality:  Monopolizing  Affect:  Excited  Cognitive:  Oriented  Insight: None  Engagement in Group:  Distracting  Modes of Intervention:  Education and Exploration  Additional Comments:  Patient attended group however she was angry and disrespectful. She had to be taken out of group to get some medication.  Lita Mains [facility] 04/03/2023, 8:53 PM

## 2023-04-03 NOTE — Group Note (Signed)
Recreation Therapy Group Note   Group Topic:Leisure Education  Group Date: 04/03/2023 Start Time: 1000 End Time: 1036 Facilitators: Burak Zerbe-McCall, LRT,CTRS Location: [address] Dayroom   Goal Area(s) Addresses:  Patient will successfully identify positive leisure and recreation activities.  Patient will acknowledge benefits of participation in healthy leisure activities post discharge.  Patient will actively work with peers toward a shared goal.   Group Description: Pictionary. In groups of 5-7, patients took turns trying to guess the picture being drawn on the board by their teammate.  If the team guessed the correct answer, they won a point.  If the team guessed wrong, the other team got a chance to steal the point. After several rounds of game play, the team with the most points were declared winners. Post-activity discussion reviewed benefits of positive recreation outlets: reducing stress, improving coping mechanisms, increasing self-esteem, and building larger support systems.   Affect/Mood: N/A   Participation Level: Did not attend    Clinical Observations/Individualized Feedback:     Plan: Continue to engage patient in RT group sessions 2-3x/week.   Daelyn Mozer-McCall, LRT,CTRS  04/03/2023 12:36 PM

## 2023-04-03 NOTE — Progress Notes (Signed)
   04/02/23 2100  Psych Admission Type (Psych Patients Only)  Admission Status Involuntary  Psychosocial Assessment  Patient Complaints Anxiety;Depression  Eye Contact Brief  Facial Expression Anxious;Flat  Affect Flat  Speech Slow  Interaction Cautious;Demanding  Motor Activity Slow  Appearance/Hygiene Disheveled  Behavior Characteristics Agitated;Irritable  Mood Anxious;Irritable  Thought Process  Coherency Circumstantial  Content Preoccupation  Delusions Paranoid  Perception Hallucinations  Hallucination None reported or observed  Judgment Poor  Confusion Mild  Danger to Self  Current suicidal ideation? Denies  Agreement Not to Harm Self Yes  Description of Agreement Verbal  Danger to Others  Danger to Others None reported or observed   Patient reports "my anxiety is 10/10, depression 10/10 all because I'm in here, I want to get out of here now." Patient demanding to see a Dr Modena Jansky, or she will go home, "I want to leave" Irritable, anxious, volatile, verbally abusive and  argumentative interacting with this Clinical research associate during assessment. Received PRN Ativan 2 mg PO for agitation, hydroxyzine 50 mg for anxiety, along with  her scheduled medications without any side effects reported. Emotional support and encouragement provided. Patient reassessed with medication effective, patient resting on her bed. Patient denies SI, HI, AVH, and pain. Safety maintained via Q 15 minute checks.

## 2023-04-03 NOTE — Progress Notes (Signed)
Dar Note: Patient presents with a flat affect and depressed mood.  Denies suicidal thoughts, auditory and visual hallucinations.  Medication given as prescribed.  Did not attend group when invited.  Patient isolative to her room for majority of this shift.  Routine safety checks maintained.  Patient is safe on and off the unit.  Offered no complaints.

## 2023-04-04 LAB — PREGNANCY, URINE: Preg Test, Ur: NEGATIVE

## 2023-04-04 NOTE — Progress Notes (Signed)
   04/04/23 0629  15 Minute Checks  Location Bedroom  Visual Appearance Calm  Behavior Composed  Sleep (Behavioral Health Patients Only)  Calculate sleep? (Click Yes once per 24 hr at 0600 safety check) Yes  Documented sleep last 24 hours 6.5

## 2023-04-04 NOTE — Progress Notes (Signed)
Pt stated she takes the robaxin for her back and neck pain , pt encouraged to talk to the doctor about it tomorrow

## 2023-04-04 NOTE — Progress Notes (Signed)
Pt had a blister pack of Robaxin brought into her this evening, medication was locked up , will discuss with pt in the morning and let her know if she needs that medication she needs to talk to the doctor and can get it ordered.

## 2023-04-04 NOTE — Group Note (Signed)
Recreation Therapy Group Note   Group Topic:Problem Solving  Group Date: 04/04/2023 Start Time: 1005 End Time: 1026 Facilitators: Kadian Barcellos-McCall, LRT,CTRS Location: [address] Dayroom   Goal Area(s) Addresses:  Patient will effectively work with peer towards shared goal.  Patient will identify skills used to make activity successful.  Patient will identify how skills used during activity can be applied to reach post d/c goals.    Group Description: [employer]. In teams of 5-6, patients were given 11 craft pipe cleaners. Using the materials provided, patients were instructed to compete again the opposing team(s) to build the tallest free-standing structure from floor level. The activity was timed; difficulty increased by Clinical research associate as Production designer, theatre/television/film continued.  Systematically resources were removed with additional directions for example, placing one arm behind their back, working in silence, and shape stipulations. LRT facilitated post-activity discussion reviewing team processes and necessary communication skills involved in completion. Patients were encouraged to reflect how the skills utilized, or not utilized, in this activity can be incorporated to positively impact support systems post discharge.   Affect/Mood: Appropriate   Participation Level: Engaged   Participation Quality: Independent   Behavior: Appropriate   Speech/Thought Process: Focused   Insight: Good   Judgement: Good   Modes of Intervention: Problem-solving   Patient Response to Interventions:  Engaged   Education Outcome:  In group clarification offered    Clinical Observations/Individualized Feedback: Pt was bright and appropriate during group session.  Pt worked well with peers in completing activity.  Pt shared "that in anything, you need a strong foundation".  Pt also explained being able to read the ques people show such as body language and eye contact.  Lastly, pt shared you  have to be willing to accept advice when it's offered.    Plan: Continue to engage patient in RT group sessions 2-3x/week.   Joss Mcdill-McCall, LRT,CTRS 04/04/2023 11:14 AM

## 2023-04-04 NOTE — Group Note (Signed)
Date:  04/04/2023 Time:  3:02 PM  Group Topic/Focus:  Emotional Education:   The focus of this group is to discuss what feelings/emotions are, and how they are experienced.    Participation Level:  Did Not Attend  Odetta Forness W Seniya Stoffers 04/04/2023, 3:02 PM  

## 2023-04-04 NOTE — Progress Notes (Signed)
Pt complained of not being able to sleep, kept asking for something stronger to help her sleep. Pt became agitated and was medicated with 2 mg of Ativan PO. Pt then went to bed and has been a sleep since then, will continue to monitor.

## 2023-04-04 NOTE — Progress Notes (Signed)
[facility] MD Progress Note  04/04/2023 8:34 AM Sierra Ortiz  MRN:  [id number] Principal Problem: Schizoaffective disorder, bipolar type Diagnosis: Principal Problem:   Schizoaffective disorder, bipolar type Active Problems:   Delta-9-tetrahydrocannabinol (THC) dependence   Generalized anxiety disorder   Tobacco dependence   GERD (gastroesophageal reflux disease)   Asthma   Seasonal allergic rhinitis  Reason For Admission: Sierra Ortiz is a 37 year old woman with a past psychiatric history of schizoaffective d/o bipolar who initially presented to the [facility] ER on 4/15 via [employer] after she was found outside naked screaming that she was seeing demons, beasts, and hearing rats in the walls. Pt continued to escalate in agitation while at the ER, requiring involuntary commitment. She was assessed in ED and recommended for inpatient hospitalizations, she was transferred to [facility] 04/01/23.   24 hour chart review: Staff report patient was more isolative in her room not attending majority of unit groups or programming despite invitation; she attended evening group where she was reported being angry and disrespectful requiring her dismissal. Per nursing report patient had increased agitation and complained of insomnia; requesting multiple PRNs. Patient received PRN Lorazepam 2 mg PO for agitation, Hydroxyzine 50 mg for anxiety, Nicorette gum for smoking cessation. Slept 6.5 hours overnight.    Patient assessment note: Pt observed in dayroom interacting with patient's and staff. She presents casually, slightly disheveled; endorses daily bathing. Appears drowsy; she reports some insomnia during the night. Blunt, flat affect. Stable eye contact. Speech is clear & coherent. Thought contents are presented organized and logical as she is able to verbalize her reason for admission and current situation. She reports history of smoking CBD from smoke shop that she reports 'landed me in the hospital the first  time' but insists 'real weed' makes her better. Unable to rationalize with her otherwise. Provider discussed at length the effects of substance abuse on mental health. She is requesting multiple items from provider including an increase in Trazodone for improved sleep and discharge stating her short term disability expires 04/06/23. 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 States 'doctors finally got it right' regarding her medications (risperidone); denies any side effects. She currently denies any SI/HI/AVH or paranoia. There is no evidence of delusional thoughts. She reports improved sleep quality last night after receiving Trazodone and reports that her appetite is 'good'.  Per provider note: 'Patient is continues to exhibit poor insight into her current mental status, and demanding to be discharged so that she can go home and use marijuana because it is the only thing that helps her mood. She states: "All I need is my weed. It keeps my mood calm and focus. I can eat and sleep. Life is great when I have my marijuana and my my medicine, and I was out of both before I came here." Pt educated that the marijuana can trigger her psychosis, but is non receptive to the education. We will continue to reinforce the need for pt to tend to personal hygiene needs, and will most likely implement a room lock out as she was non receptive to getting out of her room to go to the day room today for unit group sessions'.   Care discussed with attending Enedina Finner, MD. Urine pregnancy ordered. Treatment plan noted below.   Total Time spent with patient: 45 minutes  Past Psychiatric History: See H & P  Past Medical History:  Past Medical History:  Diagnosis Date  Asthma    Gastric ulcer    IBS (irritable bowel syndrome)     Past Surgical History:  Procedure Laterality Date   ABDOMINAL HYSTERECTOMY     partial    Family History: History reviewed. No pertinent  family history. Family Psychiatric  History: See H & P Social History:  Social History   Substance and Sexual Activity  Alcohol Use Yes   Comment: rarely     Social History   Substance and Sexual Activity  Drug Use Yes   Types: Marijuana   Comment: daily cannabis use    Social History   Socioeconomic History   Marital status: Single    Spouse name: Not on file   Number of children: Not on file   Years of education: Not on file   Highest education level: Not on file  Occupational History   Not on file  Tobacco Use   Smoking status: Some Days    Packs/day: .2    Types: Cigarettes   Smokeless tobacco: Never  Vaping Use   Vaping Use: Never used  Substance and Sexual Activity   Alcohol use: Yes    Comment: rarely   Drug use: Yes    Types: Marijuana    Comment: daily cannabis use   Sexual activity: Not Currently  Other Topics Concern   Not on file  Social History Narrative   Not on file   Social Determinants of Health   Financial Resource Strain: Not on file  Food Insecurity: Not on file  Transportation Needs: Patient Declined (04/01/2023)   PRAPARE - Administrator, Civil Service (Medical): Patient declined    Lack of Transportation (Non-Medical): Patient declined  Physical Activity: Not on file  Stress: Not on file  Social Connections: Not on file   Sleep: Good  Appetite:  Good  Current Medications: Current Facility-Administered Medications  Medication Dose Route Frequency Provider Last Rate Last Admin   acetaminophen (TYLENOL) tablet 650 mg  650 mg Oral Q6H PRN Sindy Guadeloupe, NP   650 mg at 04/02/23 0640   albuterol (VENTOLIN HFA) 108 (90 Base) MCG/ACT inhaler 1-2 puff  1-2 puff Inhalation Q6H PRN Massengill, Harrold Donath, MD       alum & mag hydroxide-simeth (MAALOX/MYLANTA) 200-200-20 MG/5ML suspension 30 mL  30 mL Oral Q4H PRN Welford Roche, Josephine C, NP       fluticasone (FLONASE) 50 MCG/ACT nasal spray 2 spray  2 spray Each Nare Daily Nkwenti,  Doris, NP   2 spray at 04/03/23 1610   hydrOXYzine (ATARAX) tablet 50 mg  50 mg Oral TID PRN Phineas Inches, MD   50 mg at 04/03/23 2026   LORazepam (ATIVAN) tablet 2 mg  2 mg Oral Q6H PRN Abbott Pao, Nadir, MD   2 mg at 04/04/23 0014   magnesium hydroxide (MILK OF MAGNESIA) suspension 30 mL  30 mL Oral Daily PRN Dahlia Byes C, NP       nicotine (NICODERM CQ - dosed in mg/24 hours) patch 21 mg  21 mg Transdermal Daily Attiah, Nadir, MD   21 mg at 04/03/23 1012   nicotine polacrilex (NICORETTE) gum 2 mg  2 mg Oral PRN Massengill, Harrold Donath, MD   2 mg at 04/03/23 2027   OLANZapine zydis (ZYPREXA) disintegrating tablet 10 mg  10 mg Oral TID PRN Massengill, Harrold Donath, MD       Or   OLANZapine (ZYPREXA) injection 10 mg  10 mg Intramuscular TID PRN Phineas Inches, MD  pantoprazole (PROTONIX) EC tablet 40 mg  40 mg Oral Daily Onuoha, Josephine C, NP   40 mg at 04/03/23 1610   risperiDONE (RISPERDAL M-TABS) disintegrating tablet 1 mg  1 mg Oral Daily Massengill, Nathan, MD       risperiDONE (RISPERDAL M-TABS) disintegrating tablet 2 mg  2 mg Oral QHS Massengill, Nathan, MD   2 mg at 04/03/23 2024   traZODone (DESYREL) tablet 50 mg  50 mg Oral QHS Dahlia Byes C, NP   50 mg at 04/03/23 2024   Vitamin D (Ergocalciferol) (DRISDOL) 1.25 MG (50000 UNIT) capsule 50,000 Units  50,000 Units Oral Q7 days Starleen Blue, NP   50,000 Units at 04/03/23 1815    Lab Results:  Results for orders placed or performed during the hospital encounter of 04/01/23 (from the past 48 hour(s))  TSH     Status: None   Collection Time: 04/03/23  6:27 AM  Result Value Ref Range   TSH 1.324 0.350 - 4.500 uIU/mL    Comment: Performed by a 3rd Generation assay with a functional sensitivity of <=0.01 uIU/mL. Performed at [facility], [address] [place], [place] [address]   Hemoglobin A1c     Status: None   Collection Time: 04/03/23  6:27 AM  Result Value Ref Range   Hgb A1c MFr Bld 4.9 4.8  - 5.6 %    Comment: (NOTE) Pre diabetes:          5.7%-6.4%  Diabetes:              >6.4%  Glycemic control for   <7.0% adults with diabetes    Mean Plasma Glucose 93.93 mg/dL    Comment: Performed at [facility], [address], [place], [place] [address]  Lipid panel     Status: Abnormal   Collection Time: 04/03/23  6:27 AM  Result Value Ref Range   Cholesterol 170 0 - 200 mg/dL   Triglycerides 72 <191 mg/dL   HDL 43 >47 mg/dL   Total CHOL/HDL Ratio 4.0 RATIO   VLDL 14 0 - 40 mg/dL   LDL Cholesterol 829 (H) 0 - 99 mg/dL    Comment:        Total Cholesterol/HDL:CHD Risk Coronary Heart Disease Risk Table                     Men   Women  1/2 Average Risk   3.4   3.3  Average Risk       5.0   4.4  2 X Average Risk   9.6   7.1  3 X Average Risk  23.4   11.0        Use the calculated Patient Ratio above and the CHD Risk Table to determine the patient's CHD Risk.        ATP III CLASSIFICATION (LDL):  <100     mg/dL   Optimal  562-130  mg/dL   Near or Above                    Optimal  130-159  mg/dL   Borderline  865-784  mg/dL   High  >696     mg/dL   Very High Performed at [facility], [address], [place], [place] [address]   hCG, quantitative, pregnancy     Status: None   Collection Time: 04/03/23  6:27 AM  Result Value Ref Range   hCG, Beta Chain, Quant,  S 1 <5 mIU/mL    Comment:          GEST. AGE      CONC.  (mIU/mL)   <=1 WEEK        5 - 50     2 WEEKS       50 - 500     3 WEEKS       100 - 10,000     4 WEEKS     1,000 - 30,000     5 WEEKS     3,500 - 115,000   6-8 WEEKS     12,000 - 270,000    12 WEEKS     15,000 - 220,000        FEMALE AND NON-PREGNANT FEMALE:     LESS THAN 5 mIU/mL Performed at [facility], [address], [place], [place] [address]   VITAMIN D 25 Hydroxy (Vit-D Deficiency, Fractures)     Status: Abnormal   Collection Time: 04/03/23  6:27 AM  Result Value Ref Range   Vit D, 25-Hydroxy  15.19 (L) 30 - 100 ng/mL    Comment: (NOTE) Vitamin D deficiency has been defined by the Institute of Medicine  and an Endocrine Society practice guideline as a level of serum 25-OH  vitamin D less than 20 ng/mL (1,2). The Endocrine Society went on to  further define vitamin D insufficiency as a level between 21 and 29  ng/mL (2).  1. IOM (Institute of Medicine). 2010. Dietary reference intakes for  calcium and D. [place]: [employer]. 2. Holick MF, Binkley Pueblito, Bischoff-Ferrari HA, et al. Evaluation,  treatment, and prevention of vitamin D deficiency: an Endocrine  Society clinical practice guideline, JCEM. 2011 Jul; 96(7): 1911-30.  Performed at [facility], [address], [place], [place] [address]     Blood Alcohol level:  Lab Results  Component Value Date   ETH <10 03/31/2023   ETH <10 04/22/2021    Metabolic Disorder Labs: Lab Results  Component Value Date   HGBA1C 4.9 04/03/2023   MPG 93.93 04/03/2023   MPG 103 05/12/2021   No results found for: "PROLACTIN" Lab Results  Component Value Date   CHOL 170 04/03/2023   TRIG 72 04/03/2023   HDL 43 04/03/2023   CHOLHDL 4.0 04/03/2023   VLDL 14 04/03/2023   LDLCALC 113 (H) 04/03/2023   LDLCALC 138 (H) 05/12/2021    Physical Findings: AIMS:  , ,  ,  ,    CIWA:    COWS:     Musculoskeletal: Strength & Muscle Tone: within normal limits Gait & Station: normal Patient leans: N/A  Psychiatric Specialty Exam:  Presentation  General Appearance:  Appropriate for Environment; Fairly Groomed  Eye Contact: Good  Speech: Clear and Coherent  Speech Volume: Normal  Handedness: Right  Mood and Affect  Mood: Anxious; Irritable; Angry; Depressed  Affect: Congruent  Thought Process  Thought Processes: Coherent  Descriptions of Associations:Intact  Orientation:Full (Time, Place and Person)  Thought Content:Logical  History of Schizophrenia/Schizoaffective disorder:No  data recorded Duration of Psychotic Symptoms:No data recorded Hallucinations:Hallucinations: None  Ideas of Reference:None  Suicidal Thoughts:Suicidal Thoughts: No  Homicidal Thoughts:Homicidal Thoughts: No  Sensorium  Memory: Remote Poor  Judgment: Poor  Insight: Poor   Executive Functions  Concentration: Poor  Attention Span: Poor  Recall: Poor  Fund of Knowledge: Poor  Language: Poor  Psychomotor Activity  Psychomotor Activity: Psychomotor Activity: Normal  Assets  Assets: Housing  Sleep  Sleep: Sleep: Good  Physical Exam: Physical Exam Constitutional:      Appearance: Normal appearance.  HENT:     Head: Normocephalic.     Nose: Nose normal. No congestion.  Eyes:     Pupils: Pupils are equal, round, and reactive to light.  Pulmonary:     Effort: Pulmonary effort is normal.  Musculoskeletal:        General: Normal range of motion.     Cervical back: Normal range of motion.  Neurological:     Mental Status: She is alert and oriented to person, place, and time.  Psychiatric:        Attention and Perception: She does not perceive auditory or visual hallucinations.        Mood and Affect: Affect is flat.        Speech: Speech normal.        Behavior: Behavior is slowed.        Thought Content: Thought content is not paranoid. Thought content does not include homicidal or suicidal ideation. Thought content does not include homicidal or suicidal plan.        Judgment: Judgment is impulsive.    Review of Systems  Constitutional:  Negative for fever.  HENT:  Negative for hearing loss.   Eyes:  Negative for blurred vision.  Respiratory:  Negative for cough.   Cardiovascular:  Negative for chest pain.  Gastrointestinal:  Negative for heartburn.  Genitourinary:  Negative for dysuria.  Musculoskeletal:  Negative for myalgias.  Skin:  Negative for rash.  Neurological:  Negative for dizziness.  Psychiatric/Behavioral:  Positive for substance  abuse. Negative for depression, hallucinations, memory loss and suicidal ideas. The patient has insomnia. The patient is not nervous/anxious.    Blood pressure 108/78, pulse (!) 102, temperature 97.6 F (36.4 C), temperature source Oral, resp. rate 16, height 5\' 2"  (1.575 m), weight 48.3 kg, SpO2 100 %. Body mass index is 19.46 kg/m.  Treatment Plan Summary: Daily contact with patient to assess and evaluate symptoms and progress in treatment and Medication management   Observation Level/Precautions:  15 minute checks  Laboratory:  Labs reviewed   Psychotherapy:  Unit Group sessions  Medications:  See [facility]  Consultations:  To be determined   Discharge Concerns:  Safety, medication compliance, mood stability  Estimated LOS: 5-7 days  Other:  N/A    PLAN Safety and Monitoring: Voluntary admission to inpatient psychiatric unit for safety, stabilization and treatment Daily contact with patient to assess and evaluate symptoms and progress in treatment Patient's case to be discussed in multi-disciplinary team meeting Observation Level : q15 minute checks Vital signs: q12 hours Precautions: Safety   Long Term Goal(s): Improvement in symptoms so as ready for discharge   Short Term Goals: Ability to identify changes in lifestyle to reduce recurrence of condition will improve, Ability to verbalize feelings will improve, Ability to disclose and discuss suicidal ideas, Ability to demonstrate self-control will improve, Ability to identify and develop effective coping behaviors will improve, Ability to maintain clinical measurements within normal limits will improve, and Ability to identify triggers associated with substance abuse/mental health issues will improve   Diagnoses Principal Problem:   Schizoaffective disorder, bipolar type Active Problems:   Delta-9-tetrahydrocannabinol (THC) dependence   Generalized anxiety disorder   Tobacco dependence   GERD (gastroesophageal reflux disease)    Asthma   Seasonal allergic rhinitis   Medications -Continue: - Risperdal 1 mg in the mornings  - Risperdal 2 mg nightly for  mood stabilization (home med) - Vitamin D 16109 units weekly for Vit D deficiency - Trazodone 50 mg nightly for sleep - Nicotine patch 21 mg TD Daily smoking cessation  -Holding off on Depakote at this time until after pregnancy test results: urine pregnancy test ordered 04/04/23  Medical Issues: -Continue: - Protonix 40 mg daily for GERD - Zyprexa 10 mg im/po for agitation tid prn   Other PRNs: - Tylenol 650 mg every 6 hours PRN for mild pain - Maalox 30 mg every 4 hrs PRN for indigestion - Imodium 2-4 mg as needed for diarrhea - Milk of Magnesia as needed every 6 hrs for constipation - Zofran disintegrating tabs every 6 hrs PRN for nausea  - Ativan 2 mg as needed every 6 hours for agitation - hydroxyzine 50 mg 3 times daily as needed for anxiety - albuterol 1 to 2 puffs every 6 hours as needed for shortness of breath or wheezing     ANTIPSYCHOTIC CONSENT : We discussed the risks, benefits, side effects, and alternatives to THE PRESCRIBED ANTIPSYCHOTIC, including but not limited to, the risk of fatigue, sedation, metabolic syndrome, weight gain, movement abnormalities such as tremor & cogwheeling & tardive dyskinesia, temperature sensitivity, photosensitivity, blood pressure changes, heart rhythm effects, potential for medication interactions, and to not take these medications with alcohol or illicit drugs; informed consent was obtained. We discussed the necessity for routine monitoring including rating scales of abnormal movements, blood work, and ekgs, while the patient is prescribed antipsychotic medication.    Discharge Planning: Social work and case management to assist with discharge planning and identification of hospital follow-up needs prior to discharge Estimated LOS: 5-7 days Discharge Concerns: Need to establish a safety plan; Medication compliance  and effectiveness Discharge Goals: Return home with outpatient referrals for mental health follow-up including medication management/psychotherapy   I certify that inpatient services furnished can reasonably be expected to improve the patient's condition.    Loletta Parish, NP 04/04/2023, 8:34 AM  Patient ID: Sierra Ortiz, female   DOB: [birth year], 37 y.o.   MRN: [id number]

## 2023-04-04 NOTE — [facility] Group Notes (Signed)
Spiritual care group facilitated by Chaplain Dyanne Carrel, [facility]  Group focused on topic of strength. Group members reflected on what thoughts and feelings emerge when they hear this topic. They then engaged in facilitated dialog around how strength is present in their lives. This dialog focused on representing what strength had been to them in their lives (images and patterns given) and what they saw as helpful in their life now (what they needed / wanted).  Activity drew on narrative framework.  Patient Progress: Did not attend.

## 2023-04-04 NOTE — Progress Notes (Signed)
   04/04/23 0956  Psych Admission Type (Psych Patients Only)  Admission Status Involuntary  Psychosocial Assessment  Patient Complaints Anxiety  Eye Contact Fair  Facial Expression Anxious  Affect Flat  Speech Logical/coherent  Interaction Assertive  Motor Activity Slow  Appearance/Hygiene Unremarkable  Behavior Characteristics Cooperative  Mood Depressed  Thought Process  Coherency WDL  Content Preoccupation  Delusions Paranoid  Perception WDL  Hallucination None reported or observed  Judgment Poor  Confusion None  Danger to Self  Current suicidal ideation? Denies  Danger to Others  Danger to Others None reported or observed

## 2023-04-04 NOTE — Progress Notes (Signed)
Pt continues to be paranoid, but stated she trusts Clinical research associate. Pt stated she was feeling better    04/04/23 2145  Psych Admission Type (Psych Patients Only)  Admission Status Involuntary  Psychosocial Assessment  Patient Complaints Anxiety  Eye Contact Fair  Facial Expression Anxious  Affect Flat  Speech Logical/coherent  Interaction Assertive  Motor Activity Slow  Appearance/Hygiene Unremarkable  Behavior Characteristics Cooperative  Mood Suspicious;Depressed  Aggressive Behavior  Effect No apparent injury  Thought Process  Coherency Circumstantial  Content Paranoia;Preoccupation  Delusions Paranoid  Perception WDL  Hallucination None reported or observed  Judgment Poor  Confusion None  Danger to Self  Current suicidal ideation? Denies  Danger to Others  Danger to Others None reported or observed

## 2023-04-04 NOTE — Group Note (Signed)
Date:  04/04/2023 Time:  9:31 PM  Group Topic/Focus:  Wrap-Up Group:   The focus of this group is to help patients review their daily goal of treatment and discuss progress on daily workbooks.    Participation Level:  Active  Participation Quality:  Appropriate  Affect:  Appropriate  Cognitive:  Appropriate  Insight: Appropriate  Engagement in Group:  Engaged  Modes of Intervention:  Education and Exploration  Additional Comments:  Patient attended and participated in group tonight. She reports that the best thing that happened for her today was that her daughter visited. She also listened to  music and danced.  Sierra Ortiz [facility] 04/04/2023, 9:31 PM

## 2023-04-04 NOTE — Group Note (Signed)
Date:  04/04/2023 Time:  12:06 PM  Group Topic/Focus:  Goals Group:   The focus of this group is to help patients establish daily goals to achieve during treatment and discuss how the patient can incorporate goal setting into their daily lives to aide in recovery. Orientation:   The focus of this group is to educate the patient on the purpose and policies of crisis stabilization and provide a format to answer questions about their admission.  The group details unit policies and expectations of patients while admitted.    Participation Level:  Active  Participation Quality:  Appropriate  Affect:  Appropriate  Cognitive:  Appropriate  Insight: Appropriate  Engagement in Group:  Engaged  Modes of Intervention:  Discussion  Additional Comments:   Patient attended morning orientation/goal setting group and said that her goal for today is to get thru the day. Patient also did the group Self-assessment tools worksheet.  Alysson Geist W Silvanna Ohmer 04/04/2023, 12:06 PM

## 2023-04-05 MED ORDER — RISPERIDONE 2 MG PO TBDP
2.0000 mg | ORAL_TABLET | Freq: Two times a day (BID) | ORAL | Status: DC
  Administered 2023-04-05 – 2023-04-08 (×6): 2 mg via ORAL
  Filled 2023-04-05 (×9): qty 1

## 2023-04-05 MED ORDER — LINACLOTIDE 145 MCG PO CAPS
145.0000 ug | ORAL_CAPSULE | Freq: Every day | ORAL | Status: DC
  Administered 2023-04-06 – 2023-04-08 (×3): 145 ug via ORAL
  Filled 2023-04-05 (×7): qty 1

## 2023-04-05 MED ORDER — TRAZODONE HCL 100 MG PO TABS
100.0000 mg | ORAL_TABLET | Freq: Every day | ORAL | Status: DC
  Administered 2023-04-05 – 2023-04-07 (×3): 100 mg via ORAL
  Filled 2023-04-05 (×7): qty 1

## 2023-04-05 MED ORDER — METHOCARBAMOL 500 MG PO TABS
500.0000 mg | ORAL_TABLET | Freq: Four times a day (QID) | ORAL | Status: DC | PRN
  Administered 2023-04-05 – 2023-04-07 (×4): 500 mg via ORAL
  Filled 2023-04-05 (×4): qty 1

## 2023-04-05 NOTE — Progress Notes (Signed)
Pt stated she was pleased with the adjustments to her medications especially the increase in the Trazodone    04/05/23 2015  Psych Admission Type (Psych Patients Only)  Admission Status Involuntary  Psychosocial Assessment  Patient Complaints Anxiety  Eye Contact Fair  Facial Expression Anxious  Affect Flat  Speech Logical/coherent  Interaction Assertive  Motor Activity Slow  Appearance/Hygiene Unremarkable  Behavior Characteristics Cooperative  Mood Suspicious;Depressed  Aggressive Behavior  Effect No apparent injury  Thought Process  Coherency Circumstantial  Content Paranoia;Preoccupation  Delusions Paranoid  Perception WDL  Hallucination None reported or observed  Judgment Poor  Confusion None  Danger to Self  Current suicidal ideation? Denies  Danger to Others  Danger to Others None reported or observed

## 2023-04-05 NOTE — Progress Notes (Addendum)
Pt stated she takes 100 mg Trazodone at night, pt required multiple PRNs per [facility] after her HS medication due to not sleeping

## 2023-04-05 NOTE — Group Note (Signed)
Date:  04/05/2023 Time:  9:29 PM  Group Topic/Focus:  Wrap-Up Group:   The focus of this group is to help patients review their daily goal of treatment and discuss progress on daily workbooks.    Participation Level:  Active  Participation Quality:  Appropriate  Affect:  Appropriate  Cognitive:  Appropriate  Insight: Appropriate  Engagement in Group:  Engaged  Modes of Intervention:  Education and Exploration  Additional Comments:  Patient attended and participated in group tonight. She reports that she like that she likes to take care of other people.  Lita Mains [facility] 04/05/2023, 9:29 PM

## 2023-04-05 NOTE — Progress Notes (Signed)
[facility] MD Progress Note  04/05/2023 2:28 PM Sierra Ortiz  MRN:  [id number] Principal Problem: Schizoaffective disorder, bipolar type Diagnosis: Principal Problem:   Schizoaffective disorder, bipolar type Active Problems:   Delta-9-tetrahydrocannabinol (THC) dependence   Generalized anxiety disorder   Tobacco dependence   GERD (gastroesophageal reflux disease)   Asthma   Seasonal allergic rhinitis  Reason For Admission: Sierra Ortiz is a 37 yo AAF with a past mental health diagnosis of schizoaffective d/o bipolar d/o who presented to the [facility] on 4/15 with GPD after she was found outside naked screaming that she was seeing demons, beasts, and hearing rats in the walls. Pt continued to escalate in agitation while at the ER, requiring involuntary commitment prior to transportation to this [facility].   24 hour chart review: Sleep Hours last night: Not documented, but nursing ,documented poor sleep, and pt confirmed this.  Nursing Concerns: none  Behavioral episodes in the past 24 WUJ:WJXB   Medication Compliance: Compliant  Vital Signs in the past 24 hrs: HR with some periods of sustained elevations since admission. PRN Medications in the past 24 hrs: Ativan 2 mg last night, Hydroxyzine 25 mg earlier today morning.  Patient assessment note: Pt initially irritable when writer presented to the room and turned her light on. She stated: "I was getting some good rest. You just fucked that up." Pt was able to warm up after writer turned the light off and apologized to her. She denies SI, denies HI, denies AVH, denies paranoia or other delusional thoughts. She continues to lack insight regarding the severity of her THC use and how that can negatively impact on her mental health, and defends her use of THC each time that attempts are made to educate her about it.  Pt has been educated on the fact that she needs to get out of her room and attend unit group sessions, and has verbalized  understanding. She is persistent in her requests to be discharged, and has been educated that she has to participate more in unit group activities and stay more out of her room, otherwise, she will get a room lock out. She is requesting for paper work to be completed for her leave of absence as her last day is 4/22, and has been notified that she will need to get this paperwork herself, and bring it to [place] to complete.   Pt reports a fair appetite, reports a poor sleep quality last night. We are increasing Trazodone to 100 mg nightly, and increasing Risperdal to 2 mg BID with a goal of starting the Risperdal Consta injection prior to discharge as pt is interested in the LAI due to her history of noncompliance with medications. She is also requesting Linzess for her IBS and Robaxin which was initial started at an outpatient facility. This medication was virified by Clinical research associate, and consists of 500 mg Robaxin Q 6 H with no refills. We are ordering this medication for pt while she is here at the hospital and she understands that medication will not be prescribed for her at discharge.  Total Time spent with patient: 45 minutes  Past Psychiatric History: See H & P  Past Medical History:  Past Medical History:  Diagnosis Date   Asthma    Gastric ulcer    IBS (irritable bowel syndrome)     Past Surgical History:  Procedure Laterality Date   ABDOMINAL HYSTERECTOMY     partial    Family History: History reviewed. No pertinent family history.  Family Psychiatric  History: See H & P Social History:  Social History   Substance and Sexual Activity  Alcohol Use Yes   Comment: rarely     Social History   Substance and Sexual Activity  Drug Use Yes   Types: Marijuana   Comment: daily cannabis use    Social History   Socioeconomic History   Marital status: Single    Spouse name: Not on file   Number of children: Not on file   Years of education: Not on file   Highest education level: Not on file   Occupational History   Not on file  Tobacco Use   Smoking status: Some Days    Packs/day: .2    Types: Cigarettes   Smokeless tobacco: Never  Vaping Use   Vaping Use: Never used  Substance and Sexual Activity   Alcohol use: Yes    Comment: rarely   Drug use: Yes    Types: Marijuana    Comment: daily cannabis use   Sexual activity: Not Currently  Other Topics Concern   Not on file  Social History Narrative   Not on file   Social Determinants of Health   Financial Resource Strain: Not on file  Food Insecurity: Not on file  Transportation Needs: Patient Declined (04/01/2023)   PRAPARE - Administrator, Civil Service (Medical): Patient declined    Lack of Transportation (Non-Medical): Patient declined  Physical Activity: Not on file  Stress: Not on file  Social Connections: Not on file   Sleep: Good  Appetite:  Good  Current Medications: Current Facility-Administered Medications  Medication Dose Route Frequency Provider Last Rate Last Admin   acetaminophen (TYLENOL) tablet 650 mg  650 mg Oral Q6H PRN Sindy Guadeloupe, NP   650 mg at 04/04/23 1642   albuterol (VENTOLIN HFA) 108 (90 Base) MCG/ACT inhaler 1-2 puff  1-2 puff Inhalation Q6H PRN Massengill, Harrold Donath, MD       alum & mag hydroxide-simeth (MAALOX/MYLANTA) 200-200-20 MG/5ML suspension 30 mL  30 mL Oral Q4H PRN Welford Roche, Josephine C, NP       fluticasone (FLONASE) 50 MCG/ACT nasal spray 2 spray  2 spray Each Nare Daily Starleen Blue, NP   2 spray at 04/05/23 4540   hydrOXYzine (ATARAX) tablet 50 mg  50 mg Oral TID PRN Phineas Inches, MD   50 mg at 04/05/23 1107   [START ON 04/06/2023] linaclotide (LINZESS) capsule 145 mcg  145 mcg Oral QAC breakfast Nyles Mitton, NP       magnesium hydroxide (MILK OF MAGNESIA) suspension 30 mL  30 mL Oral Daily PRN Welford Roche, Josephine C, NP       methocarbamol (ROBAXIN) tablet 500 mg  500 mg Oral Q6H PRN Starleen Blue, NP       nicotine polacrilex (NICORETTE) gum 2 mg  2  mg Oral PRN Massengill, Harrold Donath, MD   2 mg at 04/05/23 1106   OLANZapine zydis (ZYPREXA) disintegrating tablet 10 mg  10 mg Oral TID PRN Phineas Inches, MD       Or   OLANZapine (ZYPREXA) injection 10 mg  10 mg Intramuscular TID PRN Massengill, Harrold Donath, MD       pantoprazole (PROTONIX) EC tablet 40 mg  40 mg Oral Daily Onuoha, Josephine C, NP   40 mg at 04/05/23 9811   risperiDONE (RISPERDAL M-TABS) disintegrating tablet 2 mg  2 mg Oral BID Starleen Blue, NP       traZODone (DESYREL) tablet 100 mg  100  mg Oral QHS Sedrick Tober, NP       Vitamin D (Ergocalciferol) (DRISDOL) 1.25 MG (50000 UNIT) capsule 50,000 Units  50,000 Units Oral Q7 days Starleen Blue, NP   50,000 Units at 04/03/23 1815    Lab Results:  Results for orders placed or performed during the hospital encounter of 04/01/23 (from the past 48 hour(s))  Pregnancy, urine     Status: None   Collection Time: 04/04/23 12:04 PM  Result Value Ref Range   Preg Test, Ur NEGATIVE NEGATIVE    Comment:        THE SENSITIVITY OF THIS METHODOLOGY IS >20 mIU/mL. Performed at [facility], [address], [place], [place] [address]     Blood Alcohol level:  Lab Results  Component Value Date   ETH <10 03/31/2023   ETH <10 04/22/2021    Metabolic Disorder Labs: Lab Results  Component Value Date   HGBA1C 4.9 04/03/2023   MPG 93.93 04/03/2023   MPG 103 05/12/2021   No results found for: "PROLACTIN" Lab Results  Component Value Date   CHOL 170 04/03/2023   TRIG 72 04/03/2023   HDL 43 04/03/2023   CHOLHDL 4.0 04/03/2023   VLDL 14 04/03/2023   LDLCALC 113 (H) 04/03/2023   LDLCALC 138 (H) 05/12/2021    Physical Findings: AIMS:  , ,  ,  ,    CIWA:    COWS:     Musculoskeletal: Strength & Muscle Tone: within normal limits Gait & Station: normal Patient leans: N/A  Psychiatric Specialty Exam:  Presentation  General Appearance:  Disheveled  Eye Contact: Fair  Speech: Clear and  Coherent  Speech Volume: Normal  Handedness: Right   Mood and Affect  Mood: Irritable; Anxious; Depressed  Affect: Congruent   Thought Process  Thought Processes: Coherent  Descriptions of Associations:Intact  Orientation:Full (Time, Place and Person)  Thought Content:Logical  History of Schizophrenia/Schizoaffective disorder:No data recorded Duration of Psychotic Symptoms:No data recorded Hallucinations:Hallucinations: None   Ideas of Reference:None  Suicidal Thoughts:Suicidal Thoughts: No   Homicidal Thoughts:Homicidal Thoughts: No    Sensorium  Memory: Immediate Good  Judgment: Poor  Insight: Poor   Executive Functions  Concentration: Fair  Attention Span: Fair  Recall: Fair  Fund of Knowledge: Fair  Language: Fair  Psychomotor Activity  Psychomotor Activity: Psychomotor Activity: Normal   Assets  Assets: Housing; Resilience  Sleep  Sleep: Sleep: Poor   Physical Exam: Physical Exam Constitutional:      Appearance: Normal appearance.  HENT:     Head: Normocephalic.     Nose: Nose normal. No congestion.  Eyes:     Pupils: Pupils are equal, round, and reactive to light.  Pulmonary:     Effort: Pulmonary effort is normal.  Musculoskeletal:        General: Normal range of motion.     Cervical back: Normal range of motion.  Neurological:     Mental Status: She is alert and oriented to person, place, and time.  Psychiatric:        Behavior: Behavior normal.        Thought Content: Thought content normal.    Review of Systems  Constitutional:  Negative for fever.  HENT:  Negative for hearing loss.   Eyes:  Negative for blurred vision.  Respiratory:  Negative for cough.   Cardiovascular:  Negative for chest pain.  Gastrointestinal:  Negative for heartburn.  Genitourinary:  Negative for dysuria.  Musculoskeletal:  Negative for myalgias.  Skin:  Negative for rash.  Neurological:  Negative for dizziness.   Psychiatric/Behavioral:  Positive for depression and substance abuse. Negative for hallucinations, memory loss and suicidal ideas. The patient is nervous/anxious and has insomnia.    Blood pressure 111/70, pulse (!) 105, temperature 98.4 F (36.9 C), temperature source Oral, resp. rate 16, height 5\' 2"  (1.575 m), weight 48.3 kg, SpO2 100 %. Body mass index is 19.46 kg/m.  Treatment Plan Summary: Treatment Plan Summary: Daily contact with patient to assess and evaluate symptoms and progress in treatment and Medication management   Observation Level/Precautions:  15 minute checks  Laboratory:  Labs reviewed   Psychotherapy:  Unit Group sessions  Medications:  See [facility]  Consultations:  To be determined   Discharge Concerns:  Safety, medication compliance, mood stability  Estimated LOS: 5-7 days  Other:  N/A    PLAN Safety and Monitoring: Voluntary admission to inpatient psychiatric unit for safety, stabilization and treatment Daily contact with patient to assess and evaluate symptoms and progress in treatment Patient's case to be discussed in multi-disciplinary team meeting Observation Level : q15 minute checks Vital signs: q12 hours Precautions: Safety   Long Term Goal(s): Improvement in symptoms so as ready for discharge   Short Term Goals: Ability to identify changes in lifestyle to reduce recurrence of condition will improve, Ability to verbalize feelings will improve, Ability to disclose and discuss suicidal ideas, Ability to demonstrate self-control will improve, Ability to identify and develop effective coping behaviors will improve, Ability to maintain clinical measurements within normal limits will improve, and Ability to identify triggers associated with substance abuse/mental health issues will improve   Diagnoses Principal Problem:   Schizoaffective disorder, bipolar type Active Problems:   Delta-9-tetrahydrocannabinol (THC) dependence   Generalized anxiety disorder    Tobacco dependence   GERD (gastroesophageal reflux disease)   Asthma   Seasonal allergic rhinitis   Medications -Increase Risperdal to 2 mg BID for mood stabilization Goal is to start LAI prior to discharge -Continue Vitamin D 16109 units weekly for Vit D deficiency -Start Robaxin 500 mg Q 6 H PRN for muscle spasms-home med-Understands that medication will not be prescribed at discharge. -Increase Trazodone to 100 mg nightly for sleep -Start Linzess 145 mcg for IBS -Discontinue Ativan 2 mg as needed every 6 hours for agitation -Continue hydroxyzine 50 mg 3 times daily as needed for anxiety -Continue albuterol 1 to 2 puffs every 6 hours as needed for shortness of breath or wheezing  -Continue Protonix 40 mg daily for GERD -Continue Zyprexa 10 mg im/po for agitation tid prn   Other PRNS -Continue Tylenol 650 mg every 6 hours PRN for mild pain -Continue Maalox 30 mg every 4 hrs PRN for indigestion -Continue Imodium 2-4 mg as needed for diarrhea -Continue Milk of Magnesia as needed every 6 hrs for constipation -Continue Zofran disintegrating tabs every 6 hrs PRN for nausea    ANTIPSYCHOTIC CONSENT : We discussed the risks, benefits, side effects, and alternatives to THE PRESCRIBED ANTIPSYCHOTIC, including but not limited to, the risk of fatigue, sedation, metabolic syndrome, weight gain, movement abnormalities such as tremor & cogwheeling & tardive dyskinesia, temperature sensitivity, photosensitivity, blood pressure changes, heart rhythm effects, potential for medication interactions, and to not take these medications with alcohol or illicit drugs; informed consent was obtained. We discussed the necessity for routine monitoring including rating scales of abnormal movements, blood work, and ekgs, while the patient is prescribed antipsychotic medication.    Discharge Planning: Social work and case management  to assist with discharge planning and identification of hospital follow-up needs prior  to discharge Estimated LOS: 5-7 days Discharge Concerns: Need to establish a safety plan; Medication compliance and effectiveness Discharge Goals: Return home with outpatient referrals for mental health follow-up including medication management/psychotherapy   I certify that inpatient services furnished can reasonably be expected to improve the patient's condition.    Starleen Blue, NP 04/05/2023, 2:28 PMPatient ID: Sierra Ortiz, female   DOB: 12-May-1986, 37 y.o.   MRN: [id number]

## 2023-04-06 ENCOUNTER — Encounter (HOSPITAL_COMMUNITY): Payer: Self-pay | Admitting: Nurse Practitioner

## 2023-04-06 MED ORDER — TRAZODONE HCL 50 MG PO TABS
50.0000 mg | ORAL_TABLET | Freq: Every evening | ORAL | Status: DC | PRN
  Administered 2023-04-07 – 2023-04-08 (×2): 50 mg via ORAL
  Filled 2023-04-06 (×2): qty 1

## 2023-04-06 NOTE — [facility] Group Notes (Signed)
Adult Psychoeducational Group Note  Date:  04/06/2023 Time:  8:46 PM  Group Topic/Focus:  Wrap-Up Group:   The focus of this group is to help patients review their daily goal of treatment and discuss progress on daily workbooks.  Participation Level:  Active  Participation Quality:  Appropriate  Affect:  Appropriate  Cognitive:  Appropriate  Insight: Appropriate  Engagement in Group:  Engaged  Modes of Intervention:  Discussion  Additional Comments:   Pt states that she's had a good day and enjoyed celebrating one of her peers birthday throughout the day. Pt expressed anxiety about D/C and states that she is still seeing "floaters" in one of her eyes. Pt states she stays positive and exercises throughout the day.   Vevelyn Pat 04/06/2023, 8:46 PM

## 2023-04-06 NOTE — Progress Notes (Signed)
Pt continued to have issues sleeping, pt given another dose Vistaril per [facility], pt encouraged to talk to doctor about possibly adding something to the Trazodone, pt stated that after awhile her body gets used to Trazodone. Pt was reminded that the increase in the Trazodone was what she requested to help her sleep.

## 2023-04-06 NOTE — Progress Notes (Signed)
Pt up a little agitated, pt stated she was having racing thoughts also, pt given PRN Zyprexa per [facility]

## 2023-04-06 NOTE — Progress Notes (Signed)
   04/06/23 2045  Psych Admission Type (Psych Patients Only)  Admission Status Involuntary  Psychosocial Assessment  Patient Complaints Anxiety  Eye Contact Fair  Facial Expression Anxious  Affect Flat  Speech Logical/coherent  Interaction Assertive  Motor Activity Slow  Appearance/Hygiene Unremarkable  Behavior Characteristics Cooperative  Mood Suspicious;Depressed  Aggressive Behavior  Effect No apparent injury  Thought Process  Coherency Circumstantial  Content Paranoia;Preoccupation  Delusions Paranoid  Perception WDL  Hallucination None reported or observed  Judgment Poor  Confusion None  Danger to Self  Current suicidal ideation? Denies  Danger to Others  Danger to Others None reported or observed

## 2023-04-06 NOTE — [facility] Group Notes (Signed)
Adult Psychoeducational Group Note  Date:  04/06/2023 Time:  6:44 PM  Group Topic/Focus:  Goals Group:   The focus of this group is to help patients establish daily goals to achieve during treatment and discuss how the patient can incorporate goal setting into their daily lives to aide in recovery. Orientation:   The focus of this group is to educate the patient on the purpose and policies of crisis stabilization and provide a format to answer questions about their admission.  The group details unit policies and expectations of patients while admitted.  Participation Level:  Did Not Attend  Participation Quality:    Affect:    Cognitive:    Insight:   Engagement in Group:    Modes of Intervention:    Additional Comments:    Sheran Lawless 04/06/2023, 6:44 PM

## 2023-04-06 NOTE — Progress Notes (Signed)
   04/06/23 0900  Charting Type  Charting Type Shift assessment  Safety Check Verification  Has the RN verified the 15 minute safety check completion? Yes  Neurological  Neuro (WDL) WDL  HEENT  HEENT (WDL) WDL  Respiratory  Respiratory (WDL) WDL  Cardiac  Cardiac (WDL) WDL  Vascular  Vascular (WDL) WDL  Integumentary  Integumentary (WDL) WDL  Braden Scale (Ages 8 and up)  Sensory Perceptions 4  Moisture 4  Activity 4  Mobility 4  Nutrition 3  Friction and Shear 3  Braden Scale Score 22  Musculoskeletal  Musculoskeletal (WDL) WDL  Gastrointestinal  Gastrointestinal (WDL) WDL  GU Assessment  Genitourinary (WDL) WDL  Neurological  Level of Consciousness Alert   Patient c/o increased " floaters" in her eyes and some dizziness.

## 2023-04-06 NOTE — Progress Notes (Signed)
[facility] MD Progress Note  04/06/2023 8:38 AM Sierra Ortiz  MRN:  [id number] Principal Problem: Schizoaffective disorder, bipolar type Diagnosis: Principal Problem:   Schizoaffective disorder, bipolar type Active Problems:   Delta-9-tetrahydrocannabinol (THC) dependence   Generalized anxiety disorder   Tobacco dependence   GERD (gastroesophageal reflux disease)   Asthma   Seasonal allergic rhinitis  Reason For Admission: Sierra Ortiz is a 37 yo AAF with a past mental health diagnosis of schizoaffective d/o bipolar d/o who presented to the [facility] on 4/15 with [facility] after she was found outside naked screaming that she was seeing demons, beasts, and hearing rats in the walls. Pt continued to escalate in agitation while at the ER, requiring involuntary commitment prior to transportation to this [facility]   24 hour chart review: Staff report patient did not sleep last night and was drowsy during the day. Trazodone was increased to 100 mg HS; last night was first dose. No behavioral concerns noted. Vital signs have remained within normal limits. Medication compliant; PRN Medications received: Hydroxyzine 50 mg earlier in the morning for anxiety, Robaxin 500 mg for muscle spasms, Nicorette gum for smoking cessation. Trazodone 50 mg PRN HS started today.   Patient assessment note: Pt observed in dayroom during snack time watching television with peers and staff. Assessment completed in her room where she presented calm and cooperative. Less drowsy than days prior. Alert and oriented. Able to recall day, time, place, and situation. She reports sleeping 'lowsy' last night despite receiving Trazodone; inquires about Ativan. Provider discussed at length inability to prescribe Ativan at this time and discussed available PRNs including Trazodone 50 mg PRN added today in addition to her scheduled 100 mg dose. Mentioned Gabapentin  She reports appetite and mood as 'good, I just can't sleep for whatever  reason'. She describes 'not being able to turn my mind off at night'. Denies any involuntary movements, feelings of constant movement, or any other side effects of medications at this time. She insists Risperidone is 'the right medication' and wants to remain on medication. No physical complaints during assessment.   She continues to request assistance with obtaining paper work to be completed for her leave of absence as her last day is 4/22, and has been notified that she will need to get this paperwork herself, and bring it to [place] to complete. She is denying any SI/HI/AVH. Thoughts appear linear and logical with improved insight than days prior. She is contracting for safety and is amenable to treatment. Case review with attending MD Sierra Ortiz and treatment team noted below.   Per chart review provider from yesterday did discuss the following: Pt reports a fair appetite, reports a poor sleep quality last night. We are increasing Trazodone to 100 mg nightly, and increasing Risperdal to 2 mg BID with a goal of starting the Risperdal Consta injection prior to discharge as pt is interested in the LAI due to her history of noncompliance with medications. She is also requesting Linzess for her IBS and Robaxin which was initial started at an outpatient facility. This medication was virified by Clinical research associate, and consists of 500 mg Robaxin Q 6 H with no refills. We are ordering this medication for pt while she is here at the hospital and she understands that medication will not be prescribed for her at discharge.  Total Time spent with patient: 45 minutes  Past Psychiatric History: See H & P  Past Medical History:  Past Medical History:  Diagnosis Date   Asthma  Gastric ulcer    IBS (irritable bowel syndrome)     Past Surgical History:  Procedure Laterality Date   ABDOMINAL HYSTERECTOMY     partial    Family History: History reviewed. No pertinent family history. Family Psychiatric  History: See H &  P Social History:  Social History   Substance and Sexual Activity  Alcohol Use Yes   Comment: rarely     Social History   Substance and Sexual Activity  Drug Use Yes   Types: Marijuana   Comment: daily cannabis use    Social History   Socioeconomic History   Marital status: Single    Spouse name: Not on file   Number of children: Not on file   Years of education: Not on file   Highest education level: Not on file  Occupational History   Not on file  Tobacco Use   Smoking status: Some Days    Packs/day: .2    Types: Cigarettes   Smokeless tobacco: Never  Vaping Use   Vaping Use: Never used  Substance and Sexual Activity   Alcohol use: Yes    Comment: rarely   Drug use: Yes    Types: Marijuana    Comment: daily cannabis use   Sexual activity: Not Currently  Other Topics Concern   Not on file  Social History Narrative   Not on file   Social Determinants of Health   Financial Resource Strain: Not on file  Food Insecurity: Not on file  Transportation Needs: Patient Declined (04/01/2023)   PRAPARE - Administrator, Civil Service (Medical): Patient declined    Lack of Transportation (Non-Medical): Patient declined  Physical Activity: Not on file  Stress: Not on file  Social Connections: Not on file   Sleep: Good  Appetite:  Good  Current Medications: Current Facility-Administered Medications  Medication Dose Route Frequency Provider Last Rate Last Admin   acetaminophen (TYLENOL) tablet 650 mg  650 mg Oral Q6H PRN Sindy Guadeloupe, NP   650 mg at 04/04/23 1642   albuterol (VENTOLIN HFA) 108 (90 Base) MCG/ACT inhaler 1-2 puff  1-2 puff Inhalation Q6H PRN Massengill, Harrold Donath, MD       alum & mag hydroxide-simeth (MAALOX/MYLANTA) 200-200-20 MG/5ML suspension 30 mL  30 mL Oral Q4H PRN Welford Roche, Josephine C, NP       fluticasone (FLONASE) 50 MCG/ACT nasal spray 2 spray  2 spray Each Nare Daily Nkwenti, Doris, NP   2 spray at 04/06/23 0724   hydrOXYzine  (ATARAX) tablet 50 mg  50 mg Oral TID PRN Phineas Inches, MD   50 mg at 04/06/23 0036   linaclotide (LINZESS) capsule 145 mcg  145 mcg Oral QAC breakfast Starleen Blue, NP   145 mcg at 04/06/23 4098   magnesium hydroxide (MILK OF MAGNESIA) suspension 30 mL  30 mL Oral Daily PRN Dahlia Byes C, NP       methocarbamol (ROBAXIN) tablet 500 mg  500 mg Oral Q6H PRN Starleen Blue, NP   500 mg at 04/05/23 2038   nicotine polacrilex (NICORETTE) gum 2 mg  2 mg Oral PRN Massengill, Harrold Donath, MD   2 mg at 04/06/23 0728   OLANZapine zydis (ZYPREXA) disintegrating tablet 10 mg  10 mg Oral TID PRN Phineas Inches, MD       Or   OLANZapine (ZYPREXA) injection 10 mg  10 mg Intramuscular TID PRN Massengill, Harrold Donath, MD       pantoprazole (PROTONIX) EC tablet 40 mg  40 mg  Oral Daily Dahlia Byes C, NP   40 mg at 04/06/23 1610   risperiDONE (RISPERDAL M-TABS) disintegrating tablet 2 mg  2 mg Oral BID Starleen Blue, NP   2 mg at 04/06/23 0725   traZODone (DESYREL) tablet 100 mg  100 mg Oral QHS Nkwenti, Doris, NP   100 mg at 04/05/23 2038   Vitamin D (Ergocalciferol) (DRISDOL) 1.25 MG (50000 UNIT) capsule 50,000 Units  50,000 Units Oral Q7 days Starleen Blue, NP   50,000 Units at 04/03/23 1815    Lab Results:  Results for orders placed or performed during the hospital encounter of 04/01/23 (from the past 48 hour(s))  Pregnancy, urine     Status: None   Collection Time: 04/04/23 12:04 PM  Result Value Ref Range   Preg Test, Ur NEGATIVE NEGATIVE    Comment:        THE SENSITIVITY OF THIS METHODOLOGY IS >20 mIU/mL. Performed at [facility], [address] [place], [place] [address]     Blood Alcohol level:  Lab Results  Component Value Date   ETH <10 03/31/2023   ETH <10 04/22/2021    Metabolic Disorder Labs: Lab Results  Component Value Date   HGBA1C 4.9 04/03/2023   MPG 93.93 04/03/2023   MPG 103 05/12/2021   No results found for: "PROLACTIN" Lab Results   Component Value Date   CHOL 170 04/03/2023   TRIG 72 04/03/2023   HDL 43 04/03/2023   CHOLHDL 4.0 04/03/2023   VLDL 14 04/03/2023   LDLCALC 113 (H) 04/03/2023   LDLCALC 138 (H) 05/12/2021    Physical Findings: AIMS:  , ,  ,  ,    CIWA:    COWS:     Musculoskeletal: Strength & Muscle Tone: within normal limits Gait & Station: normal Patient leans: N/A  Psychiatric Specialty Exam:  Presentation  General Appearance:  Disheveled  Eye Contact: Fair  Speech: Clear and Coherent  Speech Volume: Normal  Handedness: Right   Mood and Affect  Mood: Irritable; Anxious; Depressed  Affect: Congruent   Thought Process  Thought Processes: Coherent  Descriptions of Associations:Intact  Orientation:Full (Time, Place and Person)  Thought Content:Logical  History of Schizophrenia/Schizoaffective disorder:No data recorded Duration of Psychotic Symptoms:No data recorded Hallucinations:Hallucinations: None   Ideas of Reference:None  Suicidal Thoughts:Suicidal Thoughts: No   Homicidal Thoughts:Homicidal Thoughts: No    Sensorium  Memory: Immediate Good  Judgment: Poor  Insight: Poor   Executive Functions  Concentration: Fair  Attention Span: Fair  Recall: Fair  Fund of Knowledge: Fair  Language: Fair  Psychomotor Activity  Psychomotor Activity: Psychomotor Activity: Normal   Assets  Assets: Housing; Resilience  Sleep  Sleep: Sleep: Poor   Physical Exam: Physical Exam Constitutional:      Appearance: Normal appearance.  HENT:     Head: Normocephalic.     Nose: Nose normal. No congestion.  Eyes:     Pupils: Pupils are equal, round, and reactive to light.  Pulmonary:     Effort: Pulmonary effort is normal.  Musculoskeletal:        General: Normal range of motion.     Cervical back: Normal range of motion.  Neurological:     Mental Status: She is alert and oriented to person, place, and time.  Psychiatric:         Behavior: Behavior normal.        Thought Content: Thought content normal.    Review of Systems  Constitutional:  Negative for fever.  HENT:  Negative for hearing loss.   Eyes:  Negative for blurred vision.  Respiratory:  Negative for cough.   Cardiovascular:  Negative for chest pain.  Gastrointestinal:  Negative for heartburn.  Genitourinary:  Negative for dysuria.  Musculoskeletal:  Negative for myalgias.  Skin:  Negative for rash.  Neurological:  Negative for dizziness.  Psychiatric/Behavioral:  Positive for substance abuse. Negative for hallucinations, memory loss and suicidal ideas. The patient has insomnia.    Blood pressure 100/75, pulse 88, temperature 98.6 F (37 C), temperature source Oral, resp. rate 16, height 5\' 2"  (1.575 m), weight 48.3 kg, SpO2 100 %. Body mass index is 19.46 kg/m.  Treatment Plan Summary: Daily contact with patient to assess and evaluate symptoms and progress in treatment and Medication management   Observation Level/Precautions:  15 minute checks  Laboratory:  Labs reviewed   Psychotherapy:  Unit Group sessions  Medications:  See [facility]  Consultations:  To be determined   Discharge Concerns:  Safety, medication compliance, mood stability  Estimated LOS: 5-7 days  Other:  N/A    PLAN Safety and Monitoring: Voluntary admission to inpatient psychiatric unit for safety, stabilization and treatment Daily contact with patient to assess and evaluate symptoms and progress in treatment Patient's case to be discussed in multi-disciplinary team meeting Observation Level : q15 minute checks Vital signs: q12 hours Precautions: Safety   Long Term Goal(s): Improvement in symptoms so as ready for discharge   Short Term Goals: Ability to identify changes in lifestyle to reduce recurrence of condition will improve, Ability to verbalize feelings will improve, Ability to disclose and discuss suicidal ideas, Ability to demonstrate self-control will improve,  Ability to identify and develop effective coping behaviors will improve, Ability to maintain clinical measurements within normal limits will improve, and Ability to identify triggers associated with substance abuse/mental health issues will improve   Diagnoses Principal Problem:   Schizoaffective disorder, bipolar type Active Problems:   Delta-9-tetrahydrocannabinol (THC) dependence   Generalized anxiety disorder   Tobacco dependence   GERD (gastroesophageal reflux disease)   Asthma   Seasonal allergic rhinitis   Medications: Continue: - Risperdal to 2 mg BID for mood stabilization (increased 04/05/23); Goal is to start LAI prior to discharge -Trazodone to 100 mg nightly for sleep  Medications for other medical conditions: - Vitamin D 16109 units weekly for Vit D deficiency - Linzess 145 mcg for IBS -Discontinue Ativan 2 mg as needed every 6 hours for agitation - Protonix 40 mg daily for GERD  Other PRNs: - Trazodone 50 mg qHS PRN sleep - Zyprexa 10 mg im/po for agitation tid prn - Hydroxyzine 50 mg 3 times daily as needed for anxiety - Albuterol 1 to 2 puffs every 6 hours as needed for shortness of breath or wheezing  - Robaxin 500 mg Q 6 H PRN for muscle spasms-home med-Understands that medication will not be prescribed at discharge. - Tylenol 650 mg every 6 hours PRN for mild pain - Maalox 30 mg every 4 hrs PRN for indigestion - Imodium 2-4 mg as needed for diarrhea - Milk of Magnesia as needed every 6 hrs for constipation - Zofran disintegrating tabs every 6 hrs PRN for nausea    ANTIPSYCHOTIC CONSENT : We discussed the risks, benefits, side effects, and alternatives to THE PRESCRIBED ANTIPSYCHOTIC, including but not limited to, the risk of fatigue, sedation, metabolic syndrome, weight gain, movement abnormalities such as tremor & cogwheeling & tardive dyskinesia, temperature sensitivity, photosensitivity, blood pressure changes, heart rhythm effects,  potential for medication  interactions, and to not take these medications with alcohol or illicit drugs; informed consent was obtained. We discussed the necessity for routine monitoring including rating scales of abnormal movements, blood work, and ekgs, while the patient is prescribed antipsychotic medication.    Discharge Planning: Social work and case management to assist with discharge planning and identification of hospital follow-up needs prior to discharge Estimated LOS: 5-7 days Discharge Concerns: Need to establish a safety plan; Medication compliance and effectiveness Discharge Goals: Return home with outpatient referrals for mental health follow-up including medication management/psychotherapy   I certify that inpatient services furnished can reasonably be expected to improve the patient's condition.    Loletta Parish, NP 04/06/2023, 8:38 AM  Patient ID: Sierra Ortiz, female   DOB: 06-20-1986, 37 y.o.   MRN: [id number]

## 2023-04-06 NOTE — Progress Notes (Signed)
Pt stated she did not sleep well, but pt stated she was sleeping off and on during the day, pt was encouraged to try not to sleep during the day and educated on how sleeping during the day affects her night time sleep , pt stated she would give it another try before requesting something else for sleep. Pt was reminded that she received what she requested

## 2023-04-07 ENCOUNTER — Encounter (HOSPITAL_COMMUNITY): Payer: Self-pay

## 2023-04-07 ENCOUNTER — Other Ambulatory Visit (HOSPITAL_COMMUNITY): Payer: Self-pay

## 2023-04-07 MED ORDER — MELATONIN 3 MG PO TABS
3.0000 mg | ORAL_TABLET | Freq: Every day | ORAL | Status: DC
  Administered 2023-04-07: 3 mg via ORAL
  Filled 2023-04-07 (×5): qty 1

## 2023-04-07 MED ORDER — PHENYLEPHRINE-MINERAL OIL-PET 0.25-14-74.9 % RE OINT
1.0000 | TOPICAL_OINTMENT | Freq: Two times a day (BID) | RECTAL | Status: DC | PRN
  Filled 2023-04-07: qty 57

## 2023-04-07 MED ORDER — HYDROCORTISONE (PERIANAL) 2.5 % EX CREA: TOPICAL_CREAM | Freq: Two times a day (BID) | CUTANEOUS | Status: DC | PRN

## 2023-04-07 NOTE — Progress Notes (Signed)
[facility] MD Progress Note  04/07/2023 6:44 PM [name]  MRN:  [id number] Principal Problem: Schizoaffective disorder, bipolar type Diagnosis: Principal Problem:   Schizoaffective disorder, bipolar type Active Problems:   Delta-9-tetrahydrocannabinol (THC) dependence   Generalized anxiety disorder   Tobacco dependence   GERD (gastroesophageal reflux disease)   Asthma   Seasonal allergic rhinitis  Reason For Admission: Sierra Ortiz is a 37 yo AAF with a past mental health diagnosis of schizoaffective d/o bipolar d/o who presented to the [facility] on 4/15 with [facility] after she was found outside naked screaming that she was seeing demons, beasts, and hearing rats in the walls. Pt continued to escalate in agitation while at the ER, requiring involuntary commitment prior to transportation to this [facility].   24 hour chart review: Sleep Hours last night: Not documented,but pt reported poor sleep last night Nursing Concerns: Reported some agitation last night requiring administration of Zyprexa 5 mg  Behavioral episodes in the past 24 hrs: Agitation   Medication Compliance: Compliant  Vital Signs in the past 24 hrs: WNL PRN Medications in the past 24 hrs: Zyprexa 10 mg   Patient assessment note: Mood has significantly improved since admission. Affect is congruent. Pt denies SI, denies HI, denies AVH. She denies paranoia and there is no evidence of delusional thinking. She is logical, she is rational, thoughts and ideas are coherent and organized, and is calm during entire assessment, and oriented to person, time, place, situation, and knows current president of the [place]. She is visible in the day room interacting with her peers, but not attending most unit group sessions and is continuing to be educated on the need to attend.  Pt reports a good appetite, reports some back pain, states that the Robaxin is helping, It was again reiterated to her today that she will not be discharged with a  prescription for Robaxin, and she verbalizes understanding. Pt denies medication related side effects.   Pt's only concern today reported is poor sleep last night, despite the fact that she was also given Zyprexa 10 mg for agitation in addition to her scheduled dose of Risperdal 2 mg and Trazodone 100 mg. Pt states today that: "I know you might not agree with me, but Marijuana is the only thing that helps me sleep. I won't be able to sleep no matter what you give me. That's why I smoke marijuana every day."  Plan was to administer LAI prior to discharging pt, but as per [facility] Pharmacist who checked pt's insurance coverage, of injectable meds, they will not cover  risperdal consta, uzedy and invega. We will therefore, have to continue PO Risperdal 2 mg nightly. With pt not getting LAI, we will discuss discharging her on 4/23 or 4/24 if f/u appointments are in place for outpatient f/u. We are starting Melatonin 3 mg nightly in addition to Trazodone 100 mg nightly for sleep.  No TD/EPS type symptoms found on assessment, and pt denies any feelings of stiffness. AIMS: 0. Pt denies all medication related side effects.  Total Time spent with patient: 45 minutes  Past Psychiatric History: See H & P  Past Medical History:  Past Medical History:  Diagnosis Date   Asthma    Gastric ulcer    IBS (irritable bowel syndrome)     Past Surgical History:  Procedure Laterality Date   ABDOMINAL HYSTERECTOMY     partial    Family History: History reviewed. No pertinent family history. Family Psychiatric  History: See H &  P Social History:  Social History   Substance and Sexual Activity  Alcohol Use Yes   Comment: rarely     Social History   Substance and Sexual Activity  Drug Use Yes   Types: Marijuana   Comment: daily cannabis use    Social History   Socioeconomic History   Marital status: Single    Spouse name: Not on file   Number of children: Not on file   Years of education: Not on file    Highest education level: Not on file  Occupational History   Not on file  Tobacco Use   Smoking status: Some Days    Packs/day: .2    Types: Cigarettes   Smokeless tobacco: Never  Vaping Use   Vaping Use: Never used  Substance and Sexual Activity   Alcohol use: Yes    Comment: rarely   Drug use: Yes    Types: Marijuana    Comment: daily cannabis use   Sexual activity: Not Currently  Other Topics Concern   Not on file  Social History Narrative   Not on file   Social Determinants of Health   Financial Resource Strain: Not on file  Food Insecurity: Not on file  Transportation Needs: Patient Declined (04/01/2023)   PRAPARE - Administrator, Civil Service (Medical): Patient declined    Lack of Transportation (Non-Medical): Patient declined  Physical Activity: Not on file  Stress: Not on file  Social Connections: Not on file   Sleep: Good  Appetite:  Good  Current Medications: Current Facility-Administered Medications  Medication Dose Route Frequency Provider Last Rate Last Admin   acetaminophen (TYLENOL) tablet 650 mg  650 mg Oral Q6H PRN Sindy Guadeloupe, NP   650 mg at 04/04/23 1642   albuterol (VENTOLIN HFA) 108 (90 Base) MCG/ACT inhaler 1-2 puff  1-2 puff Inhalation Q6H PRN Massengill, Harrold Donath, MD       alum & mag hydroxide-simeth (MAALOX/MYLANTA) 200-200-20 MG/5ML suspension 30 mL  30 mL Oral Q4H PRN Welford Roche, Josephine C, NP       fluticasone (FLONASE) 50 MCG/ACT nasal spray 2 spray  2 spray Each Nare Daily Anselm Aumiller, NP   2 spray at 04/06/23 0724   hydrocortisone (ANUSOL-HC) 2.5 % rectal cream   Rectal BID PRN Massengill, Harrold Donath, MD       hydrOXYzine (ATARAX) tablet 50 mg  50 mg Oral TID PRN Phineas Inches, MD   50 mg at 04/07/23 1821   linaclotide (LINZESS) capsule 145 mcg  145 mcg Oral QAC breakfast Starleen Blue, NP   145 mcg at 04/07/23 2130   magnesium hydroxide (MILK OF MAGNESIA) suspension 30 mL  30 mL Oral Daily PRN Dahlia Byes C, NP        melatonin tablet 3 mg  3 mg Oral QHS Koston Hennes, NP       methocarbamol (ROBAXIN) tablet 500 mg  500 mg Oral Q6H PRN Starleen Blue, NP   500 mg at 04/07/23 1337   nicotine polacrilex (NICORETTE) gum 2 mg  2 mg Oral PRN Massengill, Harrold Donath, MD   2 mg at 04/06/23 1850   OLANZapine zydis (ZYPREXA) disintegrating tablet 10 mg  10 mg Oral TID PRN Phineas Inches, MD   10 mg at 04/06/23 2154   Or   OLANZapine (ZYPREXA) injection 10 mg  10 mg Intramuscular TID PRN Massengill, Harrold Donath, MD       pantoprazole (PROTONIX) EC tablet 40 mg  40 mg Oral Daily Earney Navy, NP  40 mg at 04/07/23 1153   risperiDONE (RISPERDAL M-TABS) disintegrating tablet 2 mg  2 mg Oral BID Starleen Blue, NP   2 mg at 04/07/23 1153   traZODone (DESYREL) tablet 100 mg  100 mg Oral QHS Rhesa Forsberg, NP   100 mg at 04/06/23 2036   traZODone (DESYREL) tablet 50 mg  50 mg Oral QHS PRN Leevy-Johnson, Brooke A, NP   50 mg at 04/07/23 0116   Vitamin D (Ergocalciferol) (DRISDOL) 1.25 MG (50000 UNIT) capsule 50,000 Units  50,000 Units Oral Q7 days Starleen Blue, NP   50,000 Units at 04/03/23 1815    Lab Results:  No results found for this or any previous visit (from the past 48 hour(s)).   Blood Alcohol level:  Lab Results  Component Value Date   ETH <10 03/31/2023   ETH <10 04/22/2021    Metabolic Disorder Labs: Lab Results  Component Value Date   HGBA1C 4.9 04/03/2023   MPG 93.93 04/03/2023   MPG 103 05/12/2021   No results found for: "PROLACTIN" Lab Results  Component Value Date   CHOL 170 04/03/2023   TRIG 72 04/03/2023   HDL 43 04/03/2023   CHOLHDL 4.0 04/03/2023   VLDL 14 04/03/2023   LDLCALC 113 (H) 04/03/2023   LDLCALC 138 (H) 05/12/2021    Physical Findings: AIMS:  , ,  ,  ,    CIWA:    COWS:     Musculoskeletal: Strength & Muscle Tone: within normal limits Gait & Station: normal Patient leans: N/A  Psychiatric Specialty Exam:  Presentation  General Appearance:   Appropriate for Environment; Fairly Groomed  Eye Contact: Good  Speech: Clear and Coherent  Speech Volume: Normal  Handedness: Right   Mood and Affect  Mood: Depressed  Affect: Congruent   Thought Process  Thought Processes: Coherent  Descriptions of Associations:Intact  Orientation:Full (Time, Place and Person)  Thought Content:Logical  History of Schizophrenia/Schizoaffective disorder:No data recorded Duration of Psychotic Symptoms:No data recorded Hallucinations:Hallucinations: None    Ideas of Reference:None  Suicidal Thoughts:Suicidal Thoughts: No    Homicidal Thoughts:Homicidal Thoughts: No     Sensorium  Memory: Immediate Good  Judgment: Fair  Insight: Fair   Chartered certified accountant: Fair  Attention Span: Fair  Recall: [employer] of Knowledge: Fair  Language: Fair  Psychomotor Activity  Psychomotor Activity: Psychomotor Activity: Normal    Assets  Assets: Communication Skills  Sleep  Sleep: Sleep: Poor    Physical Exam: Physical Exam Constitutional:      Appearance: Normal appearance.  HENT:     Head: Normocephalic.     Nose: Nose normal. No congestion.  Eyes:     Pupils: Pupils are equal, round, and reactive to light.  Pulmonary:     Effort: Pulmonary effort is normal.  Musculoskeletal:        General: Normal range of motion.     Cervical back: Normal range of motion.  Neurological:     Mental Status: She is alert and oriented to person, place, and time.  Psychiatric:        Behavior: Behavior normal.        Thought Content: Thought content normal.    Review of Systems  Constitutional:  Negative for fever.  HENT:  Negative for hearing loss.   Eyes:  Negative for blurred vision.  Respiratory:  Negative for cough.   Cardiovascular:  Negative for chest pain.  Gastrointestinal:  Negative for heartburn.  Genitourinary:  Negative for dysuria.  Musculoskeletal:  Negative for  myalgias.  Skin:  Negative for rash.  Neurological:  Negative for dizziness.  Psychiatric/Behavioral:  Positive for depression and substance abuse. Negative for hallucinations, memory loss and suicidal ideas. The patient is nervous/anxious and has insomnia.    Blood pressure 107/67, pulse 90, temperature 97.6 F (36.4 C), temperature source Oral, resp. rate 16, height  (1.575 m), weight 48.3 kg, SpO2 100 %. Body mass index is 19.46 kg/m.  Treatment Plan Summary: Treatment Plan Summary: Daily contact with patient to assess and evaluate symptoms and progress in treatment and Medication management   Observation Level/Precautions:  15 minute checks  Laboratory:  Labs reviewed   Psychotherapy:  Unit Group sessions  Medications:  See [facility]  Consultations:  To be determined   Discharge Concerns:  Safety, medication compliance, mood stability  Estimated LOS: 5-7 days  Other:  N/A    PLAN Safety and Monitoring: Voluntary admission to inpatient psychiatric unit for safety, stabilization and treatment Daily contact with patient to assess and evaluate symptoms and progress in treatment Patient's case to be discussed in multi-disciplinary team meeting Observation Level : q15 minute checks Vital signs: q12 hours Precautions: Safety   Long Term Goal(s): Improvement in symptoms so as ready for discharge   Short Term Goals: Ability to identify changes in lifestyle to reduce recurrence of condition will improve, Ability to verbalize feelings will improve, Ability to disclose and discuss suicidal ideas, Ability to demonstrate self-control will improve, Ability to identify and develop effective coping behaviors will improve, Ability to maintain clinical measurements within normal limits will improve, and Ability to identify triggers associated with substance abuse/mental health issues will improve   Diagnoses Principal Problem:   Schizoaffective disorder, bipolar type Active Problems:    Delta-9-tetrahydrocannabinol (THC) dependence   Generalized anxiety disorder   Tobacco dependence   GERD (gastroesophageal reflux disease)   Asthma   Seasonal allergic rhinitis   Medications -Start Anusol for hemorrhoids BID PRN -Start Melatonin 3 mg nightly for sleep -Continue Risperdal to 2 mg BID for mood stabilization  -Continue Vitamin D 08657 units weekly for Vit D deficiency -Continue Robaxin 500 mg Q 6 H PRN for muscle spasms-home med-Understands that medication will not be prescribed at discharge. -Continue Trazodone to 100 mg nightly for sleep -Continue Linzess 145 mcg for IBS -Previously dc'd Ativan 2 mg as needed every 6 hours for agitation d/t med seeking type behaviors -Continue hydroxyzine 50 mg 3 times daily as needed for anxiety -Continue albuterol 1 to 2 puffs every 6 hours as needed for shortness of breath or wheezing  -Continue Protonix 40 mg daily for GERD -Continue Zyprexa 10 mg im/po for agitation tid prn   Other PRNS -Continue Tylenol 650 mg every 6 hours PRN for mild pain -Continue Maalox 30 mg every 4 hrs PRN for indigestion -Continue Imodium 2-4 mg as needed for diarrhea -Continue Milk of Magnesia as needed every 6 hrs for constipation -Continue Zofran disintegrating tabs every 6 hrs PRN for nausea    ANTIPSYCHOTIC CONSENT : We discussed the risks, benefits, side effects, and alternatives to THE PRESCRIBED ANTIPSYCHOTIC, including but not limited to, the risk of fatigue, sedation, metabolic syndrome, weight gain, movement abnormalities such as tremor & cogwheeling & tardive dyskinesia, temperature sensitivity, photosensitivity, blood pressure changes, heart rhythm effects, potential for medication interactions, and to not take these medications with alcohol or illicit drugs; informed consent was obtained. We discussed the necessity for routine monitoring including rating scales of abnormal movements, blood work, and Lobbyist,  while the patient is prescribed  antipsychotic medication.    Discharge Planning: Social work and case management to assist with discharge planning and identification of hospital follow-up needs prior to discharge Estimated LOS: 5-7 days Discharge Concerns: Need to establish a safety plan; Medication compliance and effectiveness Discharge Goals: Return home with outpatient referrals for mental health follow-up including medication management/psychotherapy   I certify that inpatient services furnished can reasonably be expected to improve the patient's condition.    Starleen Blue, NP 04/07/2023, 6:44 PMPatient ID: Sierra Ortiz, female   DOB: [birth year], 37 y.o.   MRN: [id number] Patient ID: Sierra Ortiz, female   DOB: [birth year], 37 y.o.   MRN: [id number]

## 2023-04-07 NOTE — B[facility] Group Notes (Signed)
Adult Psychoeducational Group Note  Date:  04/07/2023 Time:  4:04 PM  Group Topic/Focus:  Goals Group:   The focus of this group is to help patients establish daily goals to achieve during treatment and discuss how the patient can incorporate goal setting into their daily lives to aide in recovery. Orientation:   The focus of this group is to educate the patient on the purpose and policies of crisis stabilization and provide a format to answer questions about their admission.  The group details unit policies and expectations of patients while admitted.  Participation Level:  Did Not Attend  Participation Quality:    Affect:    Cognitive:    Insight:   Engagement in Group:    Modes of Intervention:    Additional Comments:    Kinzy Weyers O 04/07/2023, 4:04 PM 

## 2023-04-07 NOTE — Progress Notes (Signed)
   04/07/23 2045  Psych Admission Type (Psych Patients Only)  Admission Status Involuntary  Psychosocial Assessment  Patient Complaints Anxiety  Eye Contact Fair  Facial Expression Anxious  Affect Flat  Speech Logical/coherent  Interaction Assertive  Motor Activity Slow  Appearance/Hygiene Unremarkable  Behavior Characteristics Cooperative  Mood Depressed  Aggressive Behavior  Effect No apparent injury  Thought Process  Coherency Circumstantial  Content Paranoia;Preoccupation  Delusions Paranoid  Perception WDL  Hallucination None reported or observed  Judgment Poor  Confusion None  Danger to Self  Current suicidal ideation? Denies  Danger to Others  Danger to Others None reported or observed

## 2023-04-07 NOTE — Group Note (Signed)
LCSW Group Therapy Note  Group Date: 04/07/2023 Start Time: 1300 End Time: 1400   Type of Therapy and Topic:  Group Therapy - How To Cope with Nervousness about Discharge   Participation Level:  Active   Description of Group This process group involved identification of patients' feelings about discharge. Some of them are scheduled to be discharged soon, while others are new admissions, but each of them was asked to share thoughts and feelings surrounding discharge from the hospital. One common theme was that they are excited at the prospect of going home, while another was that many of them are apprehensive about sharing why they were hospitalized. Patients were given the opportunity to discuss these feelings with their peers in preparation for discharge.  Therapeutic Goals  Patient will identify their overall feelings about pending discharge. Patient will think about how they might proactively address issues that they believe will once again arise once they get home (i.e. with parents). Patients will participate in discussion about having hope for change.   Summary of Patient Progress:  [place] was very active throughout the session. She demonstrated good insight into the subject matter, and proved open to input from peers and feedback from CSW. She was respectful of peers and participated throughout the entire session.   Therapeutic Modalities Cognitive Behavioral Therapy   Beatris Si, LCSW 04/07/2023  2:26 PM

## 2023-04-07 NOTE — Group Note (Signed)
Recreation Therapy Group Note   Group Topic:Coping Skills  Group Date: 04/07/2023 Start Time: 1022 End Time: 1105 Facilitators: Caidyn Blossom-McCall, LRT,CTRS Location: [address] Dayroom   Goal Area(s) Addresses: Patient will define what a coping skill is. Patient will work to create a list of healthy coping skills beginning with each letter of the alphabet. Patient will successfully identify positive coping skills they can use post d/c.  Patient will acknowledge benefit(s) of using learned coping skills post d/c.  Group Description: Coping A to Z. Patient asked to identify what a coping skill is and when they use them. Patients with Clinical research associate discussed healthy versus unhealthy coping skills. Next patients were given a blank worksheet titled "Coping Skills A-Z" . Patients were instructed to come up with at least one positive coping skill per letter of the alphabet, addressing a specific challenge (ex: substance abuse, anger, anxiety, isolation, family, finances) patients were given 15 minutes to brainstorm, before ideas were presented to the large group. Patients and LRT debriefed on the importance of coping skill selection based on situation and back-up plans when a skill tried is not effective. At the end of group, patients were given an handout of alphabetized strategies to keep for future reference.   Affect/Mood: N/A   Participation Level: Did not attend    Clinical Observations/Individualized Feedback:     Plan: Continue to engage patient in RT group sessions 2-3x/week.   Prince Olivier-McCall, LRT,CTRS 04/07/2023 12:13 PM

## 2023-04-07 NOTE — BH IP Treatment Plan (Signed)
Interdisciplinary Treatment and Diagnostic Plan Update  04/07/2023 Time of Session: 8:30am, update Sierra Ortiz MRN: [id number]  Principal Diagnosis: Schizoaffective disorder, bipolar type  Secondary Diagnoses: Principal Problem:   Schizoaffective disorder, bipolar type Active Problems:   Delta-9-tetrahydrocannabinol (THC) dependence   Generalized anxiety disorder   Tobacco dependence   GERD (gastroesophageal reflux disease)   Asthma   Seasonal allergic rhinitis   Current Medications:  Current Facility-Administered Medications  Medication Dose Route Frequency Provider Last Rate Last Admin   acetaminophen (TYLENOL) tablet 650 mg  650 mg Oral Q6H PRN Sindy Guadeloupe, NP   650 mg at 04/04/23 1642   albuterol (VENTOLIN HFA) 108 (90 Base) MCG/ACT inhaler 1-2 puff  1-2 puff Inhalation Q6H PRN Massengill, Harrold Donath, MD       alum & mag hydroxide-simeth (MAALOX/MYLANTA) 200-200-20 MG/5ML suspension 30 mL  30 mL Oral Q4H PRN Dahlia Byes C, NP       fluticasone (FLONASE) 50 MCG/ACT nasal spray 2 spray  2 spray Each Nare Daily Nkwenti, Doris, NP   2 spray at 04/06/23 0724   hydrocortisone (ANUSOL-HC) 2.5 % rectal cream   Rectal BID PRN Massengill, Harrold Donath, MD       hydrOXYzine (ATARAX) tablet 50 mg  50 mg Oral TID PRN Phineas Inches, MD   50 mg at 04/06/23 2036   linaclotide (LINZESS) capsule 145 mcg  145 mcg Oral QAC breakfast Starleen Blue, NP   145 mcg at 04/07/23 4098   magnesium hydroxide (MILK OF MAGNESIA) suspension 30 mL  30 mL Oral Daily PRN Dahlia Byes C, NP       methocarbamol (ROBAXIN) tablet 500 mg  500 mg Oral Q6H PRN Starleen Blue, NP   500 mg at 04/07/23 1337   nicotine polacrilex (NICORETTE) gum 2 mg  2 mg Oral PRN Massengill, Harrold Donath, MD   2 mg at 04/06/23 1850   OLANZapine zydis (ZYPREXA) disintegrating tablet 10 mg  10 mg Oral TID PRN Phineas Inches, MD   10 mg at 04/06/23 2154   Or   OLANZapine (ZYPREXA) injection 10 mg  10 mg Intramuscular TID PRN  Massengill, Harrold Donath, MD       pantoprazole (PROTONIX) EC tablet 40 mg  40 mg Oral Daily Onuoha, Josephine C, NP   40 mg at 04/07/23 1153   risperiDONE (RISPERDAL M-TABS) disintegrating tablet 2 mg  2 mg Oral BID Starleen Blue, NP   2 mg at 04/07/23 1153   traZODone (DESYREL) tablet 100 mg  100 mg Oral QHS Nkwenti, Doris, NP   100 mg at 04/06/23 2036   traZODone (DESYREL) tablet 50 mg  50 mg Oral QHS PRN Leevy-Johnson, Brooke A, NP   50 mg at 04/07/23 0116   Vitamin D (Ergocalciferol) (DRISDOL) 1.25 MG (50000 UNIT) capsule 50,000 Units  50,000 Units Oral Q7 days Starleen Blue, NP   50,000 Units at 04/03/23 1815   PTA Medications: No medications prior to admission.    Patient Stressors:    Patient Strengths: Supportive family/friends   Treatment Modalities: Medication Management, Group therapy, Case management,  1 to 1 session with clinician, Psychoeducation, Recreational therapy.   Physician Treatment Plan for Primary Diagnosis: Schizoaffective disorder, bipolar type Long Term Goal(s): Improvement in symptoms so as ready for discharge   Short Term Goals: Ability to identify changes in lifestyle to reduce recurrence of condition will improve Ability to verbalize feelings will improve Ability to disclose and discuss suicidal ideas Ability to demonstrate self-control will improve Ability to identify and develop effective coping behaviors  will improve Ability to maintain clinical measurements within normal limits will improve Ability to identify triggers associated with substance abuse/mental health issues will improve  Medication Management: Evaluate patient's response, side effects, and tolerance of medication regimen.  Therapeutic Interventions: 1 to 1 sessions, Unit Group sessions and Medication administration.  Evaluation of Outcomes: Progressing  Physician Treatment Plan for Secondary Diagnosis: Principal Problem:   Schizoaffective disorder, bipolar type Active Problems:    Delta-9-tetrahydrocannabinol (THC) dependence   Generalized anxiety disorder   Tobacco dependence   GERD (gastroesophageal reflux disease)   Asthma   Seasonal allergic rhinitis  Long Term Goal(s): Improvement in symptoms so as ready for discharge   Short Term Goals: Ability to identify changes in lifestyle to reduce recurrence of condition will improve Ability to verbalize feelings will improve Ability to disclose and discuss suicidal ideas Ability to demonstrate self-control will improve Ability to identify and develop effective coping behaviors will improve Ability to maintain clinical measurements within normal limits will improve Ability to identify triggers associated with substance abuse/mental health issues will improve     Medication Management: Evaluate patient's response, side effects, and tolerance of medication regimen.  Therapeutic Interventions: 1 to 1 sessions, Unit Group sessions and Medication administration.  Evaluation of Outcomes: Progressing   RN Treatment Plan for Primary Diagnosis: Schizoaffective disorder, bipolar type Long Term Goal(s): Knowledge of disease and therapeutic regimen to maintain health will improve  Short Term Goals: Ability to remain free from injury will improve, Ability to verbalize frustration and anger appropriately will improve, Ability to demonstrate self-control, Ability to participate in decision making will improve, Ability to verbalize feelings will improve, Ability to disclose and discuss suicidal ideas, Ability to identify and develop effective coping behaviors will improve, and Compliance with prescribed medications will improve  Medication Management: RN will administer medications as ordered by provider, will assess and evaluate patient's response and provide education to patient for prescribed medication. RN will report any adverse and/or side effects to prescribing provider.  Therapeutic Interventions: 1 on 1 counseling sessions,  Psychoeducation, Medication administration, Evaluate responses to treatment, Monitor vital signs and CBGs as ordered, Perform/monitor CIWA, COWS, AIMS and Fall Risk screenings as ordered, Perform wound care treatments as ordered.  Evaluation of Outcomes: Progressing   LCSW Treatment Plan for Primary Diagnosis: Schizoaffective disorder, bipolar type Long Term Goal(s): Safe transition to appropriate next level of care at discharge, Engage patient in therapeutic group addressing interpersonal concerns.  Short Term Goals: Engage patient in aftercare planning with referrals and resources, Increase social support, Increase ability to appropriately verbalize feelings, Increase emotional regulation, Facilitate acceptance of mental health diagnosis and concerns, Facilitate patient progression through stages of change regarding substance use diagnoses and concerns, Identify triggers associated with mental health/substance abuse issues, and Increase skills for wellness and recovery  Therapeutic Interventions: Assess for all discharge needs, 1 to 1 time with Social worker, Explore available resources and support systems, Assess for adequacy in community support network, Educate family and significant other(s) on suicide prevention, Complete Psychosocial Assessment, Interpersonal group therapy.  Evaluation of Outcomes: Progressing   Progress in Treatment: Attending groups: Yes. Participating in groups: Yes. Taking medication as prescribed: Yes. Toleration medication: Yes. Family/Significant other contact made: No, will contact:  patient declined  Patient understands diagnosis: No. Discussing patient identified problems/goals with staff: Yes. Medical problems stabilized or resolved: Yes. Denies suicidal/homicidal ideation: Yes. Issues/concerns per patient self-inventory: No.   New problem(s) identified: No, Describe:  none reported   New Short Term/Long Term Goal(s):  medication stabilization,  elimination of SI thoughts, development of comprehensive mental wellness plan.    Patient Goals:  Pt will continue to work on goals at initial tx team meeting.  Discharge Plan or Barriers: Patient will follow up at [facility].   Reason for Continuation of Hospitalization: Anxiety Delusions  Depression Medication stabilization  Estimated Length of Stay: 1-3 days  Last 3 [place] Suicide Severity Risk Score: Flowsheet Row Ad[facility] (Current) [facility] from [facility]31/20[facility] from 02/11/2023 in [facility] Department at [facility]  C-SSRS RISK CATEGORY Low Risk No Risk No Risk       Last PHQ 2/9 Scores:     No data to display          Scribe for Treatment Team: Beatris Si, LCSW 04/07/2023 3:58 PM

## 2023-04-07 NOTE — [facility] Group Notes (Signed)
Pt was encouraged but refused to attend group discussion  

## 2023-04-08 MED ORDER — ALBUTEROL SULFATE HFA 108 (90 BASE) MCG/ACT IN AERS: 1.0000 | INHALATION_SPRAY | Freq: Four times a day (QID) | RESPIRATORY_TRACT | 0 refills | Status: AC | PRN

## 2023-04-08 MED ORDER — TRAZODONE HCL 100 MG PO TABS: 100.0000 mg | ORAL_TABLET | Freq: Every day | ORAL | 0 refills | Status: DC

## 2023-04-08 MED ORDER — RISPERIDONE 2 MG PO TABS
2.0000 mg | ORAL_TABLET | Freq: Two times a day (BID) | ORAL | Status: DC
  Filled 2023-04-08 (×3): qty 1

## 2023-04-08 MED ORDER — RISPERIDONE 2 MG PO TABS: 2.0000 mg | ORAL_TABLET | Freq: Two times a day (BID) | ORAL | 0 refills | Status: DC

## 2023-04-08 MED ORDER — HYDROXYZINE HCL 50 MG PO TABS: 50.0000 mg | ORAL_TABLET | Freq: Three times a day (TID) | ORAL | 0 refills | Status: DC | PRN

## 2023-04-08 MED ORDER — PANTOPRAZOLE SODIUM 40 MG PO TBEC: 40.0000 mg | DELAYED_RELEASE_TABLET | Freq: Every day | ORAL | 0 refills | Status: DC

## 2023-04-08 MED ORDER — HYDROCORTISONE (PERIANAL) 2.5 % EX CREA: TOPICAL_CREAM | Freq: Two times a day (BID) | CUTANEOUS | 0 refills | Status: AC | PRN

## 2023-04-08 MED ORDER — FLUTICASONE PROPIONATE 50 MCG/ACT NA SUSP: 2.0000 | Freq: Every day | NASAL | 2 refills | Status: AC

## 2023-04-08 MED ORDER — VITAMIN D (ERGOCALCIFEROL) 1.25 MG (50000 UNIT) PO CAPS: 50000.0000 [IU] | ORAL_CAPSULE | ORAL | 0 refills | Status: AC

## 2023-04-08 MED ORDER — MELATONIN 3 MG PO TABS: 3.0000 mg | ORAL_TABLET | Freq: Every day | ORAL | 0 refills | Status: AC

## 2023-04-08 MED ORDER — NICOTINE POLACRILEX 2 MG MT GUM: 2.0000 mg | CHEWING_GUM | OROMUCOSAL | 0 refills | Status: AC | PRN

## 2023-04-08 MED ORDER — LINACLOTIDE 145 MCG PO CAPS: 145.0000 ug | ORAL_CAPSULE | Freq: Every day | ORAL | 0 refills | Status: DC

## 2023-04-08 NOTE — [facility] Group Notes (Signed)
Adult Psychoeducational Group Note  Date:  04/08/2023 Time:  11:34 AM  Group Topic/Focus:  Goals Group:   The focus of this group is to help patients establish daily goals to achieve during treatment and discuss how the patient can incorporate goal setting into their daily lives to aide in recovery. Orientation:   The focus of this group is to educate the patient on the purpose and policies of crisis stabilization and provide a format to answer questions about their admission.  The group details unit policies and expectations of patients while admitted.  Participation Level:  Did Not Attend  Participation Quality:    Affect:    Cognitive:    Insight:   Engagement in Group:    Modes of Intervention:    Additional Comments:    Sheran Lawless 04/08/2023, 11:34 AM

## 2023-04-08 NOTE — Progress Notes (Signed)
Pt discharged to lobby. Pt was stable and appreciative at that time. All papers and prescriptions were given and valuables returned. Verbal understanding expressed. Denies SI/HI and A/VH. Pt given opportunity to express concerns and ask questions.  

## 2023-04-08 NOTE — Progress Notes (Signed)
Recreation Therapy Notes  INPATIENT RECREATION TR PLAN  Patient Details Name: Sierra Ortiz MRN: [id number] DOB: 1986/07/29 Today's Date: 04/08/2023  Rec Therapy Plan Is patient appropriate for Therapeutic Recreation?: Yes Treatment times per week: about 3 days Estimated Length of Stay: 5-7 days TR Treatment/Interventions: Group participation (Comment)  Discharge Criteria Pt will be discharged from therapy if:: Discharged Treatment plan/goals/alternatives discussed and agreed upon by:: Patient/family  Discharge Summary Short term goals set: See patient care plan Short term goals met: Complete Progress toward goals comments: Groups attended Which groups?: Communication, Other (Comment) (Problem Solving) Reason goals not met: None Therapeutic equipment acquired: N/A Reason patient discharged from therapy: Discharge from hospital Pt/family agrees with progress & goals achieved: Yes Date patient discharged from therapy: 04/08/23   Alethea Terhaar-McCall, LRT,CTRS Lamisha Roussell A Adesuwa Osgood-McCall 04/08/2023, 12:49 PM

## 2023-04-08 NOTE — Plan of Care (Signed)
Patient engaged in two recreation therapy group sessions in a calm and appropriate mood.   Caitlynne Harbeck-McCall, LRT,CTRS

## 2023-04-08 NOTE — Group Note (Signed)
Recreation Therapy Group Note   Group Topic:Communication  Group Date: 04/08/2023 Start Time: 1040 End Time: 1110 Facilitators: Maile Linford-McCall, LRT,CTRS Location: [address] Dayroom   Goal Area(s) Addresses:  Patient will effectively listen to complete activity.  Patient will identify communication skills used to make activity successful.  Patient will identify how skills used during activity can be used to reach post d/c goals.    Group Description: Geometric Drawings.  Three volunteers from the peer group will be shown an abstract picture with a particular arrangement of geometrical shapes.  Each round, one 'speaker' will describe the pattern, as accurately as possible without revealing the image to the group.  The remaining group members will listen and draw the picture to reflect how it is described to them. Patients with the role of 'listener' cannot ask clarifying questions but, may request that the speaker repeat a direction. Once the drawings are complete, the presenter will show the rest of the group the picture and compare how close each person came to drawing the picture. LRT will facilitate a post-activity discussion regarding effective communication and the importance of planning, listening, and asking for clarification in daily interactions with others.   Affect/Mood: Appropriate   Participation Level: Engaged   Participation Quality: Independent   Behavior: Appropriate   Speech/Thought Process: Focused   Insight: Good   Judgement: Good   Modes of Intervention: Activity   Patient Response to Interventions:  Engaged   Education Outcome:  In group clarification offered    Clinical Observations/Individualized Feedback: Pt was bright and engaged throughout activity.  Pt identified body language and tone as forms of communication.  Pt got somewhat confused and frustrated with the description of the first drawing.  Pt continued with drawing with encouragement  from peers and staff.  At conclusion of activity, pt stated the activity showed how "everyone's perception if different".      Plan: Continue to engage patient in RT group sessions 2-3x/week.   Aiyanna Awtrey-McCall, LRT,CTRS 04/08/2023 12:39 PM

## 2023-04-08 NOTE — Discharge Summary (Signed)
Physician Discharge Summary Note  Patient:  Sierra Ortiz is an 37 y.o., female MRN:  [id number] DOB:  1986/10/15 Patient phone:  [phone] (home)  Patient address:   [address] [place] [address],  Total Time spent with patient: 45 minutes  Date of Admission:  04/01/2023 Date of Discharge: 04/ 23/2023  Reason for Admission:  Reason For Admission: [name] is a 37 yo AAF with a past mental health diagnosis of schizoaffective d/o bipolar d/o who presented to the [facility] on 4/15 with [facility] after she was found outside naked screaming that she was seeing demons, beasts, and hearing rats in the walls. Pt continued to escalate in agitation while at the ER, requiring involuntary commitment prior to transportation to this [facility].   Principal Problem: Schizoaffective disorder, bipolar type Discharge Diagnoses: Principal Problem:   Schizoaffective disorder, bipolar type Active Problems:   Delta-9-tetrahydrocannabinol (THC) dependence   Generalized anxiety disorder   Tobacco dependence   GERD (gastroesophageal reflux disease)   Asthma   Seasonal allergic rhinitis  Past Psychiatric History: See H & P  Past Medical History:  Past Medical History:  Diagnosis Date   Asthma    Gastric ulcer    IBS (irritable bowel syndrome)     Past Surgical History:  Procedure Laterality Date   ABDOMINAL HYSTERECTOMY     partial    Family History: History reviewed. No pertinent family history. Family Psychiatric  History: See H & P Social History:  Social History   Substance and Sexual Activity  Alcohol Use Yes   Comment: rarely     Social History   Substance and Sexual Activity  Drug Use Yes   Types: Marijuana   Comment: daily cannabis use    Social History   Socioeconomic History   Marital status: Single    Spouse name: Not on file   Number of children: Not on file   Years of education: Not on file   Highest education level: Not on file  Occupational  History   Not on file  Tobacco Use   Smoking status: Some Days    Packs/day: .2    Types: Cigarettes   Smokeless tobacco: Never  Vaping Use   Vaping Use: Never used  Substance and Sexual Activity   Alcohol use: Yes    Comment: rarely   Drug use: Yes    Types: Marijuana    Comment: daily cannabis use   Sexual activity: Not Currently  Other Topics Concern   Not on file  Social History Narrative   Not on file   Social Determinants of Health   Financial Resource Strain: Not on file  Food Insecurity: Not on file  Transportation Needs: Patient Declined (04/01/2023)   PRAPARE - Administrator, Civil Service (Medical): Patient declined    Lack of Transportation (Non-Medical): Patient declined  Physical Activity: Not on file  Stress: Not on file  Social Connections: Not on file   Hospital Course:  During the patient's hospitalization, patient had extensive initial psychiatric evaluation, and follow-up psychiatric evaluations every day. Psychiatric diagnoses provided upon initial assessment are as listed above. Patient's psychiatric medications were adjusted on admission as follows:   -Start Risperdal 1 mg BID for mood stabilization (home med) -Holding off on Depakote at this time until after pregnancy test results -Start Trazodone 50 mg nightly for sleep -Start Ativan 2 mg as needed every 6 hours for agitation -Start hydroxyzine 50 mg 3 times daily as needed  for anxiety -Start albuterol 1 to 2 puffs every 6 hours as needed for shortness of breath or wheezing  -Start Protonix 40 mg daily for GERD   During the hospitalization, other adjustments were made to the patient's psychiatric medication regimen. Need for LAI was discussed, but pt's insurance plan will not cover this medication. Pt is being discharged on oral Risperdal. Medications at discharge are as follows:   -Continue Anusol for hemorrhoids BID PRN -Continue Melatonin 3 mg nightly for sleep -Continue Risperdal  to 2 mg BID for mood stabilization  -Continue Vitamin D 04540 units weekly for Vit D deficiency -Continue Trazodone to 100 mg nightly for sleep -Continue Linzess 145 mcg for IBS -Continue hydroxyzine 50 mg 3 times daily as needed for anxiety -Continue albuterol 1 to 2 puffs every 6 hours as needed for shortness of breath or wheezing  -Continue Protonix 40 mg daily for GERD   Patient's care was discussed during the interdisciplinary team meeting every day during the hospitalization. The patient denies having side effects to prescribed psychiatric medication. Gradually, patient started adjusting to milieu. The patient was evaluated each day by a clinical provider to ascertain response to treatment. Improvement was noted by the patient's report of decreasing symptoms, improved sleep and appetite, affect, medication tolerance, behavior, and participation in unit programming.  Patient was asked each day to complete a self inventory noting mood, mental status, pain, new symptoms, anxiety and concerns.     Symptoms were reported as significantly decreased or resolved completely by discharge. On day of discharge, the patient reports that their mood is stable. The patient denied having suicidal thoughts for more than 48 hours prior to discharge.  Patient denies having homicidal thoughts.  Patient denies having auditory hallucinations.  Patient denies any visual hallucinations or other symptoms of psychosis. The patient was motivated to continue taking medication with a goal of continued improvement in mental health.    The patient reports their target psychiatric symptoms of psychosis, depression, insomnia, anxiety responded well to the psychiatric medications, and the patient reports overall benefit from this psychiatric hospitalization. Supportive psychotherapy was provided to the patient. The patient also participated in regular group therapy while hospitalized. Coping skills, problem solving as well as  relaxation therapies were also part of the unit programming.   Labs were reviewed with the patient, and abnormal results were discussed with the patient.   The patient is able to verbalize their individual safety plan to this provider.   # It is recommended to the patient to continue psychiatric medications as prescribed, after discharge from the hospital.     # It is recommended to the patient to follow up with your outpatient psychiatric provider and PCP.   # It was discussed with the patient, the impact of alcohol, drugs, tobacco have been there overall psychiatric and medical wellbeing, and total abstinence from substance use was recommended the patient.ed.   # Prescriptions provided or sent directly to preferred pharmacy at discharge. Patient agreeable to plan. Given opportunity to ask questions. Appears to feel comfortable with discharge.    # In the event of worsening symptoms, the patient is instructed to call the crisis hotline, 911 and or go to the nearest ED for appropriate evaluation and treatment of symptoms. To follow-up with primary care provider for other medical issues, concerns and or health care needs   # Patient was discharged home with a plan to follow up as noted below.    Total Time spent with patient: 54  minutes    Physical Findings: AIMS: 0 CIWA: n/a COWS: n/a  Musculoskeletal: Strength & Muscle Tone: within normal limits Gait & Station: normal Patient leans: N/A  Psychiatric Specialty Exam:  Presentation  General Appearance:  Appropriate for Environment; Fairly Groomed  Eye Contact: Good  Speech: Clear and Coherent  Speech Volume: Normal  Handedness: Right   Mood and Affect  Mood: Euthymic  Affect: Appropriate; Congruent   Thought Process  Thought Processes: Coherent  Descriptions of Associations:Intact  Orientation:Full (Time, Place and Person)  Thought Content:Logical  History of Schizophrenia/Schizoaffective disorder:No  data recorded Duration of Psychotic Symptoms:No data recorded Hallucinations:Hallucinations: None  Ideas of Reference:None  Suicidal Thoughts:Suicidal Thoughts: No  Homicidal Thoughts:Homicidal Thoughts: No   Sensorium  Memory: Immediate Good  Judgment: Good  Insight: Good   Executive Functions  Concentration: Good  Attention Span: Good  Recall: Good  Fund of Knowledge: Good  Language: Good   Psychomotor Activity  Psychomotor Activity: Psychomotor Activity: Normal   Assets  Assets: Communication Skills; Resilience   Sleep  Sleep: Sleep: Good    Physical Exam: Physical Exam Constitutional:      Appearance: Normal appearance.  HENT:     Head: Normocephalic.     Nose: Nose normal. No congestion.  Eyes:     Pupils: Pupils are equal, round, and reactive to light.  Pulmonary:     Effort: Pulmonary effort is normal.  Musculoskeletal:     Cervical back: Normal range of motion.  Neurological:     Mental Status: She is alert and oriented to person, place, and time.    Review of Systems  Constitutional:  Negative for fever.  HENT: Negative.    Eyes: Negative.   Respiratory: Negative.    Cardiovascular: Negative.   Gastrointestinal: Negative.   Genitourinary: Negative.   Musculoskeletal: Negative.   Skin: Negative.   Neurological: Negative.  Negative for dizziness.  Psychiatric/Behavioral:  Positive for depression (resolving) and substance abuse (Educated on the negative impact of substance abuse on her mental health and on the need to abstain). Negative for hallucinations, memory loss and suicidal ideas. The patient is nervous/anxious (resolving) and has insomnia (resolving).    Blood pressure 97/72, pulse 93, temperature 98.3 F (36.8 C), temperature source Oral, resp. rate 16, height  (1.575 m), weight 48.3 kg, SpO2 100 %. Body mass index is 19.46 kg/m.   Social History   Tobacco Use  Smoking Status Some Days   Packs/day: .2    Types: Cigarettes  Smokeless Tobacco Never   Tobacco Cessation:  N/A, patient does not currently use tobacco products  Blood Alcohol level:  Lab Results  Component Value Date   ETH <10 03/31/2023   ETH <10 04/22/2021   Metabolic Disorder Labs:  Lab Results  Component Value Date   HGBA1C 4.9 04/03/2023   MPG 93.93 04/03/2023   MPG 103 05/12/2021   No results found for: "PROLACTIN" Lab Results  Component Value Date   CHOL 170 04/03/2023   TRIG 72 04/03/2023   HDL 43 04/03/2023   CHOLHDL 4.0 04/03/2023   VLDL 14 04/03/2023   LDLCALC 113 (H) 04/03/2023   LDLCALC 138 (H) 05/12/2021    See Psychiatric Specialty Exam and Suicide Risk Assessment completed by Attending Physician prior to discharge.  Discharge destination:  Home  Is patient on multiple antipsychotic therapies at discharge:  No   Has Patient had three or more failed trials of antipsychotic monotherapy by history:  No  Recommended Plan for Multiple Antipsychotic Therapies:  NA  Discharge Instructions     Diet - low sodium heart healthy   Complete by: As directed    Increase activity slowly   Complete by: As directed       Allergies as of 04/08/2023       Reactions   Haldol [haloperidol]    Dystonic reaction   Hydrocodone-acetaminophen Nausea And Vomiting   Patient reports allergy to the Hydrocodone portion of Vicodin.  She has taken and tolerated Tylenol in the past.   Vicodin [hydrocodone-acetaminophen] Nausea And Vomiting   Patient reports allergy to the Hydrocodone portion of Vicodin.  She has taken and tolerated Tylenol in the past.   Aspirin Other (See Comments), Nausea And Vomiting   "Makes me talk out of my head." Other reaction(s): Dizziness (intolerance) "Makes me talk out of my head."   Penicillin V    Other reaction(s): Dizziness (intolerance), yeast infection        Medication List     TAKE these medications      Indication  albuterol 108 (90 Base) MCG/ACT inhaler Commonly  known as: VENTOLIN HFA Inhale 1-2 puffs into the lungs every 6 (six) hours as needed for wheezing or shortness of breath.  Indication: Asthma   fluticasone 50 MCG/ACT nasal spray Commonly known as: FLONASE Place 2 sprays into both nostrils daily. Start taking on: April 09, 2023  Indication: Allergic Rhinitis   hydrocortisone 2.5 % rectal cream Commonly known as: ANUSOL-HC Place rectally 2 (two) times daily as needed for hemorrhoids or anal itching.  Indication: Inflamed Hemorrhoids   hydrOXYzine 50 MG tablet Commonly known as: ATARAX Take 1 tablet (50 mg total) by mouth 3 (three) times daily as needed for anxiety.  Indication: Feeling Anxious   linaclotide 145 MCG Caps capsule Commonly known as: LINZESS Take 1 capsule (145 mcg total) by mouth daily before breakfast for 14 days. Start taking on: April 09, 2023  Indication: Chronic Constipation of Unknown Cause   melatonin 3 MG Tabs tablet Take 1 tablet (3 mg total) by mouth at bedtime.  Indication: Trouble Sleeping   nicotine polacrilex 2 MG gum Commonly known as: NICORETTE Take 1 each (2 mg total) by mouth as needed for smoking cessation.  Indication: Nicotine Addiction   pantoprazole 40 MG tablet Commonly known as: PROTONIX Take 1 tablet (40 mg total) by mouth daily.  Indication: Heartburn   risperiDONE 2 MG tablet Commonly known as: RISPERDAL Take 1 tablet (2 mg total) by mouth 2 (two) times daily. Start taking on: April 09, 2023  Indication: Schizophrenia   traZODone 100 MG tablet Commonly known as: DESYREL Take 1 tablet (100 mg total) by mouth at bedtime.  Indication: Trouble Sleeping   Vitamin D (Ergocalciferol) 1.25 MG (50000 UNIT) Caps capsule Commonly known as: DRISDOL Take 1 capsule (50,000 Units total) by mouth every 7 (seven) days for 5 doses. Start taking on: April 10, 2023  Indication: Vitamin D Deficiency        Follow-up Information     [facility] Call.   Why: You have a phone appointment  for med management and therapy with this provider on April 29 at [facility] information: [address]  [address] [place] [address] [phone]                Signed: Starleen Blue, NP 04/08/2023, 1:37 PM

## 2023-04-08 NOTE — Progress Notes (Signed)
  [facility] Adult Case Management Discharge Plan :  Will you be returning to the same living situation after discharge:  Yes,  home At discharge, do you have transportation home?: Yes,  family will pick up Do you have the ability to pay for your medications: Yes,  insurance   Release of information consent forms completed and in the chart;  Patient's signature needed at discharge.  Patient to Follow up at:  Follow-up Information     [facility] Call.   Why: You have a phone appointment for med management and therapy with this provider on April 29 at [facility] information: [address]  [address] [place] [address] [phone]                 Next level of care provider has access to [facility] Link:no  Safety Planning and Suicide Prevention discussed: Yes,  with patient , patient declined consents and doctor notified.      Has patient been referred to the Quitline?: Patient refused referral  Patient has been referred for addiction treatment: N/A  Roiza Wiedel E Markitta Ausburn, LCSW 04/08/2023, 10:22 AM

## 2023-04-08 NOTE — Discharge Instructions (Signed)
-  Follow-up with your outpatient psychiatric provider -instructions on appointment date, time, and address (location) are provided to you in discharge paperwork.  -Take your psychiatric medications as prescribed at discharge - instructions are provided to you in the discharge paperwork  -Follow-up with outpatient primary care doctor and other specialists -for management of preventative medicine and any chronic medical disease.  -Recommend abstinence from alcohol, tobacco, and other illicit drug use at discharge.   -If your psychiatric symptoms recur, worsen, or if you have side effects to your psychiatric medications, call your outpatient psychiatric provider, 911, [phone] or go to the nearest emergency department.  -If suicidal thoughts occur, call your outpatient psychiatric provider, 911, [phone] or go to the nearest emergency department.  Naloxone (Narcan) can help reverse an overdose when given to the victim quickly.  [place] offers free naloxone kits and instructions/training on its use.  Add naloxone to your first aid kit and you can help save a life.   Pick up your free kit at the following locations:   [place]:  [facility], [address] [place] [address] ([phone]) [facility] [address] [place] [address] ([phone]) [facility] [address] [place] [address]  [place]: [facility] [address] [place] [address] ([phone]) [facility] [address] [place]  [address] ([phone])  

## 2023-04-08 NOTE — [facility] Suicide Risk Assessment (Addendum)
Suicide Risk Assessment  Discharge Assessment    [facility] Discharge Suicide Risk Assessment   Principal Problem: Schizoaffective disorder, bipolar type Discharge Diagnoses: Principal Problem:   Schizoaffective disorder, bipolar type Active Problems:   Delta-9-tetrahydrocannabinol (THC) dependence   Generalized anxiety disorder   Tobacco dependence   GERD (gastroesophageal reflux disease)   Asthma   Seasonal allergic rhinitis  Reason For Admission: Sierra Ortiz is a 37 yo AAF with a past mental health diagnosis of schizoaffective d/o bipolar d/o who presented to the [facility] on 4/15 with GPD after she was found outside naked screaming that she was seeing demons, beasts, and hearing rats in the walls. Pt continued to escalate in agitation while at the ER, requiring involuntary commitment prior to transportation to this [facility].   Hospital Course: During the patient's hospitalization, patient had extensive initial psychiatric evaluation, and follow-up psychiatric evaluations every day. Psychiatric diagnoses provided upon initial assessment are as listed above. Patient's psychiatric medications were adjusted on admission as follows:  -Start Risperdal 1 mg BID for mood stabilization (home med) -Holding off on Depakote at this time until after pregnancy test results -Start Trazodone 50 mg nightly for sleep -Start Ativan 2 mg as needed every 6 hours for agitation -Start hydroxyzine 50 mg 3 times daily as needed for anxiety -Start albuterol 1 to 2 puffs every 6 hours as needed for shortness of breath or wheezing  -Start Protonix 40 mg daily for GERD  During the hospitalization, other adjustments were made to the patient's psychiatric medication regimen. Medications at discharge are as follows:  -Continue Anusol for hemorrhoids BID PRN -Continue Melatonin 3 mg nightly for sleep -Continue Risperdal to 2 mg BID for mood stabilization  -Continue Vitamin D 16109 units weekly for Vit D  deficiency -Continue Trazodone to 100 mg nightly for sleep -Continue Linzess 145 mcg for IBS -Continue hydroxyzine 50 mg 3 times daily as needed for anxiety -Continue albuterol 1 to 2 puffs every 6 hours as needed for shortness of breath or wheezing  -Continue Protonix 40 mg daily for GERD  Patient's care was discussed during the interdisciplinary team meeting every day during the hospitalization. The patient denies having side effects to prescribed psychiatric medication. Gradually, patient started adjusting to milieu. The patient was evaluated each day by a clinical provider to ascertain response to treatment. Improvement was noted by the patient's report of decreasing symptoms, improved sleep and appetite, affect, medication tolerance, behavior, and participation in unit programming.  Patient was asked each day to complete a self inventory noting mood, mental status, pain, new symptoms, anxiety and concerns.    Symptoms were reported as significantly decreased or resolved completely by discharge. On day of discharge, the patient reports that their mood is stable. The patient denied having suicidal thoughts for more than 48 hours prior to discharge.  Patient denies having homicidal thoughts.  Patient denies having auditory hallucinations.  Patient denies any visual hallucinations or other symptoms of psychosis. The patient was motivated to continue taking medication with a goal of continued improvement in mental health.   The patient reports their target psychiatric symptoms of psychosis, depression, insomnia, anxiety responded well to the psychiatric medications, and the patient reports overall benefit from this psychiatric hospitalization. Supportive psychotherapy was provided to the patient. The patient also participated in regular group therapy while hospitalized. Coping skills, problem solving as well as relaxation therapies were also part of the unit programming.  Labs were reviewed with the  patient, and abnormal results were  discussed with the patient.  The patient is able to verbalize their individual safety plan to this provider.  # It is recommended to the patient to continue psychiatric medications as prescribed, after discharge from the hospital.    # It is recommended to the patient to follow up with your outpatient psychiatric provider and PCP.  # It was discussed with the patient, the impact of alcohol, drugs, tobacco have been there overall psychiatric and medical wellbeing, and total abstinence from substance use was recommended the patient.ed.  # Prescriptions provided or sent directly to preferred pharmacy at discharge. Patient agreeable to plan. Given opportunity to ask questions. Appears to feel comfortable with discharge.    # In the event of worsening symptoms, the patient is instructed to call the crisis hotline, 911 and or go to the nearest ED for appropriate evaluation and treatment of symptoms. To follow-up with primary care provider for other medical issues, concerns and or health care needs  # Patient was discharged home with a plan to follow up as noted below.   Total Time spent with patient: 45 minutes  Musculoskeletal: Strength & Muscle Tone: within normal limits Gait & Station: normal Patient leans: N/A  Psychiatric Specialty Exam  Presentation  General Appearance:  Appropriate for Environment; Fairly Groomed  Eye Contact: Good  Speech: Clear and Coherent  Speech Volume: Normal  Handedness: Right  Mood and Affect  Mood: Euthymic  Duration of Depression Symptoms: No data recorded Affect: Appropriate; Congruent  Thought Process  Thought Processes: Coherent  Descriptions of Associations:Intact  Orientation:Full (Time, Place and Person)  Thought Content:Logical  History of Schizophrenia/Schizoaffective disorder:No data recorded Duration of Psychotic Symptoms:No data recorded Hallucinations:Hallucinations: None  Ideas  of Reference:None  Suicidal Thoughts:Suicidal Thoughts: No  Homicidal Thoughts:Homicidal Thoughts: No  Sensorium  Memory: Immediate Good  Judgment: Good  Insight: Good  Executive Functions  Concentration: Good  Attention Span: Good  Recall: Good  Fund of Knowledge: Good  Language: Good  Psychomotor Activity  Psychomotor Activity: Psychomotor Activity: Normal  Assets  Assets: Communication Skills  Sleep  Sleep: Sleep: Good  Physical Exam: Physical Exam Constitutional:      Appearance: Normal appearance.  HENT:     Head: Normocephalic.     Nose: No congestion or rhinorrhea.  Eyes:     Pupils: Pupils are equal, round, and reactive to light.  Pulmonary:     Effort: Pulmonary effort is normal. No respiratory distress.  Musculoskeletal:        General: Normal range of motion.     Cervical back: Normal range of motion.  Neurological:     General: No focal deficit present.     Mental Status: She is alert and oriented to person, place, and time.    Review of Systems  Constitutional:  Negative for fever.  HENT:  Negative for hearing loss.   Eyes:  Negative for blurred vision.  Respiratory:  Negative for cough.   Cardiovascular:  Negative for chest pain.  Gastrointestinal:  Negative for heartburn.  Genitourinary:  Negative for dysuria.  Musculoskeletal:  Negative for myalgias.  Skin:  Negative for rash.  Neurological:  Negative for dizziness.  Psychiatric/Behavioral:  Positive for depression (resolving. Pt verbally contracts for safety outside of [facility], and she denies SI, denies HI, denies AVH, denies paranoia, there is no evidence of delusional thinking) and substance abuse (Educated on the negative impact of substance abuse on her mental health and on the need to cease using). Negative for hallucinations, memory  loss and suicidal ideas. The patient is nervous/anxious (resolving) and has insomnia (resolving).    Blood pressure 97/72, pulse 93,  temperature 98.3 F (36.8 C), temperature source Oral, resp. rate 16, height  (1.575 m), weight 48.3 kg, SpO2 100 %. Body mass index is 19.46 kg/m.  Mental Status Per Nursing Assessment::   On Admission:  NA  Demographic Factors:  Living alone  Loss Factors: NA  Historical Factors: Family history of mental illness or substance abuse  Risk Reduction Factors:   Sense of responsibility to family  Continued Clinical Symptoms:  Alcohol/Substance Abuse/Dependencies-THC abuse. Educated on the need for cessation. Denies SI, denies HI, denies AVH, verbally contracts for safety outside of [facility].  Cognitive Features That Contribute To Risk:  None    Suicide Risk:  Mild:  There are no identifiable suicide plans, no associated intent, mild dysphoria and related symptoms, good self-control (both objective and subjective assessment), few other risk factors, and identifiable protective factors, including available and accessible social support.    Follow-up Information     [facility] Call.   Why: You have a phone appointment for med management and therapy with this provider on April 29 at [facility] information: [address]  [address] [place] [address] [phone]                Starleen Blue, NP 04/08/2023, 9:50 AM

## 2023-07-29 ENCOUNTER — Emergency Department (HOSPITAL_COMMUNITY)
Admission: EM | Admit: 2023-07-29 | Discharge: 2023-07-29 | Disposition: A | Discharge status: Home / Self Care | Payer: No Typology Code available for payment source | Attending: Emergency Medicine | Admitting: Emergency Medicine

## 2023-07-29 ENCOUNTER — Other Ambulatory Visit: Payer: Self-pay

## 2023-07-29 DIAGNOSIS — R55 Syncope and collapse: Secondary | ICD-10-CM | POA: Insufficient documentation

## 2023-07-29 DIAGNOSIS — H9313 Tinnitus, bilateral: Secondary | ICD-10-CM | POA: Insufficient documentation

## 2023-07-29 LAB — URINALYSIS, DIPSTICK ONLY
Bilirubin Urine: NEGATIVE
Glucose, UA: NEGATIVE mg/dL
Hgb urine dipstick: NEGATIVE
Ketones, ur: NEGATIVE mg/dL
Leukocytes,Ua: NEGATIVE
Nitrite: NEGATIVE
Protein, ur: NEGATIVE mg/dL
Specific Gravity, Urine: 1.01 (ref 1.005–1.030)
pH: 8 (ref 5.0–8.0)

## 2023-07-29 LAB — CBC WITH DIFFERENTIAL/PLATELET
Abs Immature Granulocytes: 0.02 10*3/uL (ref 0.00–0.07)
Basophils Absolute: 0 10*3/uL (ref 0.0–0.1)
Basophils Relative: 0 %
Eosinophils Absolute: 0 10*3/uL (ref 0.0–0.5)
Eosinophils Relative: 0 %
HCT: 35.9 % — ABNORMAL LOW (ref 36.0–46.0)
Hemoglobin: 12.1 g/dL (ref 12.0–15.0)
Immature Granulocytes: 0 %
Lymphocytes Relative: 19 %
Lymphs Abs: 1.5 10*3/uL (ref 0.7–4.0)
MCH: 31.8 pg (ref 26.0–34.0)
MCHC: 33.7 g/dL (ref 30.0–36.0)
MCV: 94.5 fL (ref 80.0–100.0)
Monocytes Absolute: 0.3 10*3/uL (ref 0.1–1.0)
Monocytes Relative: 4 %
Neutro Abs: 6 10*3/uL (ref 1.7–7.7)
Neutrophils Relative %: 77 %
Platelets: 252 10*3/uL (ref 150–400)
RBC: 3.8 MIL/uL — ABNORMAL LOW (ref 3.87–5.11)
RDW: 14.4 % (ref 11.5–15.5)
WBC: 7.9 10*3/uL (ref 4.0–10.5)
nRBC: 0 % (ref 0.0–0.2)

## 2023-07-29 LAB — BASIC METABOLIC PANEL
Anion gap: 5 (ref 5–15)
BUN: 13 mg/dL (ref 6–20)
CO2: 27 mmol/L (ref 22–32)
Calcium: 8.5 mg/dL — ABNORMAL LOW (ref 8.9–10.3)
Chloride: 105 mmol/L (ref 98–111)
Creatinine, Ser: 0.9 mg/dL (ref 0.44–1.00)
GFR, Estimated: >60 mL/min (ref >60)
Glucose, Bld: 97 mg/dL (ref 70–99)
Potassium: 4.2 mmol/L (ref 3.5–5.1)
Sodium: 137 mmol/L (ref 135–145)

## 2023-07-29 LAB — PREGNANCY, URINE: Preg Test, Ur: NEGATIVE

## 2023-07-29 MED ORDER — SODIUM CHLORIDE 0.9 % IV BOLUS
1000.0000 mL | Freq: Once | INTRAVENOUS | Status: AC
  Administered 2023-07-29: 1000 mL via INTRAVENOUS

## 2023-07-29 MED ORDER — ONDANSETRON HCL 4 MG PO TABS
4.0000 mg | ORAL_TABLET | Freq: Once | ORAL | Status: AC
  Administered 2023-07-29: 4 mg via ORAL
  Filled 2023-07-29: qty 1

## 2023-07-29 NOTE — Discharge Instructions (Signed)
Your testing that was done here in the emergency department was normal.  You can follow-up with cardiology.  They will call you within 3 days for follow-up appointment.  If you have not heard from them within 3 days, you can call the number listed.  I would like you to call the neurology office in your discharge paperwork.  You can set a follow-up appointment.  You can continue taking all medications as prescribed.

## 2023-07-29 NOTE — ED Triage Notes (Signed)
Pt reports x3 episodes in the last two days of ringing in her ears, followed by vision loss, and then a brief episode of passing out. Denies falls, head injury, related hx. S/s improved when laying flat. Denies any change in meds.

## 2023-07-29 NOTE — ED Provider Notes (Signed)
[place] EMERGENCY DEPARTMENT AT [facility] Provider Note   CSN: [id number] Arrival date & time: 07/29/23  1058     History  Chief Complaint  Patient presents with   Loss of Consciousness    Sierra Ortiz is a 37 y.o. female.  37 year old female here today for 2 syncopal episodes.  Patient says that over the last 2 days she has had 2 episodes where she begins to develop a ringing in her ears and loses consciousness.  She denies any chest pain.  No family history of sudden cardiac death.  She says that she had a previous episode like this 1 month ago.  I reviewed the patient's medication list and confirmed them with her.  Past history significant for bipolar disorder, anxiety, on Risperdal.   Loss of Consciousness      Home Medications Prior to Admission medications   Medication Sig Start Date End Date Taking? Authorizing Provider  albuterol (VENTOLIN HFA) 108 (90 Base) MCG/ACT inhaler Inhale 1-2 puffs into the lungs every 6 (six) hours as needed for wheezing or shortness of breath. 04/08/23   Massengill, Harrold Donath, MD  fluticasone (FLONASE) 50 MCG/ACT nasal spray Place 2 sprays into both nostrils daily. 04/09/23   Massengill, Harrold Donath, MD  hydrocortisone (ANUSOL-HC) 2.5 % rectal cream Place rectally 2 (two) times daily as needed for hemorrhoids or anal itching. 04/08/23   Massengill, Harrold Donath, MD  hydrOXYzine (ATARAX) 50 MG tablet Take 1 tablet (50 mg total) by mouth 3 (three) times daily as needed for anxiety. 04/08/23   Massengill, Harrold Donath, MD  linaclotide [facility]) 145 MCG CAPS capsule Take 1 capsule (145 mcg total) by mouth daily before breakfast for 14 days. 04/09/23 04/23/23  Massengill, Harrold Donath, MD  melatonin 3 MG TABS tablet Take 1 tablet (3 mg total) by mouth at bedtime. 04/08/23   Massengill, Harrold Donath, MD  nicotine polacrilex (NICORETTE) 2 MG gum Take 1 each (2 mg total) by mouth as needed for smoking cessation. 04/08/23   Massengill, Harrold Donath, MD  pantoprazole (PROTONIX)  40 MG tablet Take 1 tablet (40 mg total) by mouth daily. 04/08/23 05/08/23  Massengill, Harrold Donath, MD  risperiDONE (RISPERDAL) 2 MG tablet Take 1 tablet (2 mg total) by mouth 2 (two) times daily. 04/09/23 05/09/23  Massengill, Harrold Donath, MD  traZODone (DESYREL) 100 MG tablet Take 1 tablet (100 mg total) by mouth at bedtime. 04/08/23 05/08/23  Massengill, Harrold Donath, MD      Allergies    Haldol [haloperidol], Hydrocodone-acetaminophen, Vicodin [hydrocodone-acetaminophen], Aspirin, and Penicillin v    Review of Systems   Review of Systems  Cardiovascular:  Positive for syncope.    Physical Exam Updated Vital Signs BP 112/73   Pulse 83   Temp 98.1 F (36.7 C) (Oral)   Resp 16   Ht 5\' 2"  (1.575 m)   Wt 52.2 kg   SpO2 100%   BMI 21.03 kg/m  Physical Exam Vitals reviewed.  HENT:     Head: Normocephalic and atraumatic.  Cardiovascular:     Rate and Rhythm: Normal rate and regular rhythm.     Heart sounds: No murmur heard. Pulmonary:     Effort: Pulmonary effort is normal.     Breath sounds: No wheezing.  Abdominal:     General: There is no distension.  Neurological:     General: No focal deficit present.     Mental Status: She is alert.     Coordination: Coordination normal.     Comments: Normal gait     ED Results /  Procedures / Treatments   Labs (all labs ordered are listed, but only abnormal results are displayed) Labs Reviewed  CBC WITH DIFFERENTIAL/PLATELET - Abnormal; Notable for the following components:      Result Value   RBC 3.80 (*)    HCT 35.9 (*)    All other components within normal limits  BASIC METABOLIC PANEL - Abnormal; Notable for the following components:   Calcium 8.5 (*)    All other components within normal limits  URINALYSIS, DIPSTICK ONLY - Abnormal; Notable for the following components:   Color, Urine STRAW (*)    All other components within normal limits  PREGNANCY, URINE    EKG EKG Interpretation Date/Time:  Tuesday July 29 2023 14:08:38  EDT Ventricular Rate:  86 PR Interval:  154 QRS Duration:  70 QT Interval:  391 QTC Calculation: 468 R Axis:   74  Text Interpretation: Sinus rhythm Probable anteroseptal infarct, old Normal intervals Confirmed by Anders Simmonds [phone]) on 07/29/2023 3:28:40 PM  Radiology No results found.  Procedures Procedures    Medications Ordered in ED Medications  sodium chloride 0.9 % bolus 1,000 mL (0 mLs Intravenous Stopped 07/29/23 1408)  ondansetron (ZOFRAN) tablet 4 mg (4 mg Oral Given 07/29/23 1442)    ED Course/ Medical Decision Making/ A&P                                 Medical Decision Making 37 year old female here today for 2 syncopal episodes over the last 2 days.  Differential diagnoses include vasovagal syncope, cardiogenic syncope, pseudoseizure, partial seizure, focal seizure, dehydration.  Plan-EKG ordered, blood work ordered.  Will provide the patient with some IV fluids.  Physical exam is overall reassuring.  With the ringing of the ears, I am less likely to think that this is cardiogenic.  Patient will likely require follow-up with cardiology for Holter monitoring, neurology for assessment of possible partial seizure.  Reassessment-reviewed the patient's labs, no anemia, normal electrolytes.  Pregnancy negative.  Urinalysis normal.  My dependent review the patient's EKG shows normal intervals, no Brugada type pattern.  Will discharge patient with neurology follow-up, cardiology follow-up.  Amount and/or Complexity of Data Reviewed Labs: ordered.  Risk Prescription drug management.           Final Clinical Impression(s) / ED Diagnoses Final diagnoses:  Syncope and collapse    Rx / DC Orders ED Discharge Orders          Ordered    Ambulatory referral to Cardiology       Comments: If you have not heard from the Cardiology office within the next 72 hours please call [phone].   07/29/23 1531              Anders Simmonds T, DO 07/29/23  1531

## 2023-08-07 ENCOUNTER — Ambulatory Visit (HOSPITAL_COMMUNITY)
Admission: EM | Admit: 2023-08-07 | Discharge: 2023-08-08 | Disposition: A | Discharge status: Home / Self Care | Payer: No Typology Code available for payment source | Attending: Behavioral Health | Admitting: Behavioral Health

## 2023-08-07 DIAGNOSIS — F250 Schizoaffective disorder, bipolar type: Secondary | ICD-10-CM | POA: Insufficient documentation

## 2023-08-07 DIAGNOSIS — Z79899 Other long term (current) drug therapy: Secondary | ICD-10-CM | POA: Insufficient documentation

## 2023-08-07 LAB — CBC WITH DIFFERENTIAL/PLATELET
Abs Immature Granulocytes: 0.02 10*3/uL (ref 0.00–0.07)
Basophils Absolute: 0.1 10*3/uL (ref 0.0–0.1)
Basophils Relative: 1 %
Eosinophils Absolute: 0 10*3/uL (ref 0.0–0.5)
Eosinophils Relative: 0 %
HCT: 35.8 % — ABNORMAL LOW (ref 36.0–46.0)
Hemoglobin: 12.2 g/dL (ref 12.0–15.0)
Immature Granulocytes: 0 %
Lymphocytes Relative: 31 %
Lymphs Abs: 2.1 10*3/uL (ref 0.7–4.0)
MCH: 31.2 pg (ref 26.0–34.0)
MCHC: 34.1 g/dL (ref 30.0–36.0)
MCV: 91.6 fL (ref 80.0–100.0)
Monocytes Absolute: 0.3 10*3/uL (ref 0.1–1.0)
Monocytes Relative: 5 %
Neutro Abs: 4.1 10*3/uL (ref 1.7–7.7)
Neutrophils Relative %: 63 %
Platelets: 277 10*3/uL (ref 150–400)
RBC: 3.91 MIL/uL (ref 3.87–5.11)
RDW: 13.7 % (ref 11.5–15.5)
WBC: 6.6 10*3/uL (ref 4.0–10.5)
nRBC: 0 % (ref 0.0–0.2)

## 2023-08-07 LAB — COMPREHENSIVE METABOLIC PANEL
ALT: 16 U/L (ref 0–44)
AST: 18 U/L (ref 15–41)
Albumin: 3.9 g/dL (ref 3.5–5.0)
Alkaline Phosphatase: 32 U/L — ABNORMAL LOW (ref 38–126)
Anion gap: 10 (ref 5–15)
BUN: 5 mg/dL — ABNORMAL LOW (ref 6–20)
CO2: 25 mmol/L (ref 22–32)
Calcium: 9 mg/dL (ref 8.9–10.3)
Chloride: 100 mmol/L (ref 98–111)
Creatinine, Ser: 0.83 mg/dL (ref 0.44–1.00)
GFR, Estimated: >60 mL/min (ref >60)
Glucose, Bld: 107 mg/dL — ABNORMAL HIGH (ref 70–99)
Potassium: 3.7 mmol/L (ref 3.5–5.1)
Sodium: 135 mmol/L (ref 135–145)
Total Bilirubin: 0.5 mg/dL (ref 0.3–1.2)
Total Protein: 6.4 g/dL — ABNORMAL LOW (ref 6.5–8.1)

## 2023-08-07 LAB — ETHANOL: Alcohol, Ethyl (B): <10 mg/dL (ref <10)

## 2023-08-07 MED ORDER — LORAZEPAM 1 MG PO TABS
1.0000 mg | ORAL_TABLET | Freq: Once | ORAL | Status: AC
  Administered 2023-08-07: 1 mg via ORAL
  Filled 2023-08-07: qty 1

## 2023-08-07 MED ORDER — TRAZODONE HCL 100 MG PO TABS
100.0000 mg | ORAL_TABLET | Freq: Every evening | ORAL | Status: DC | PRN
  Administered 2023-08-07: 100 mg via ORAL
  Filled 2023-08-07: qty 1

## 2023-08-07 MED ORDER — OLANZAPINE 5 MG PO TBDP
5.0000 mg | ORAL_TABLET | Freq: Every day | ORAL | Status: DC
  Administered 2023-08-07: 5 mg via ORAL
  Filled 2023-08-07: qty 1

## 2023-08-07 MED ORDER — HYDROXYZINE HCL 25 MG PO TABS
25.0000 mg | ORAL_TABLET | Freq: Three times a day (TID) | ORAL | Status: DC | PRN
  Administered 2023-08-07: 25 mg via ORAL
  Filled 2023-08-07: qty 1

## 2023-08-07 MED ORDER — TRAZODONE HCL 50 MG PO TABS: 50.0000 mg | ORAL_TABLET | Freq: Every evening | ORAL | Status: DC | PRN

## 2023-08-07 MED ORDER — MAGNESIUM HYDROXIDE 400 MG/5ML PO SUSP: 30.0000 mL | Freq: Every day | ORAL | Status: DC | PRN

## 2023-08-07 MED ORDER — OLANZAPINE 5 MG PO TBDP
5.0000 mg | ORAL_TABLET | Freq: Once | ORAL | Status: AC
  Administered 2023-08-07: 5 mg via ORAL
  Filled 2023-08-07: qty 1
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
## 2023-08-07 NOTE — ED Notes (Signed)
Pt A&O x 4, no distress noted, sullen and cooperative.  Monitoring for safety.

## 2023-08-07 NOTE — ED Notes (Signed)
Patient in milieu. Environment is secured. Will continue to monitor for safety. 
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
## 2023-08-07 NOTE — ED Notes (Signed)
Pt admitted to obs. Reports passive  SI/ denies HI/AVH. Calm, cooperative throughout interview process. Skin assessment completed. Oriented to unit. Meal and drink offered. At currrent, pt continue to   passive SI/ denies HI/AVH. Pt verbally contract for safety. Will monitor for safety.

## 2023-08-07 NOTE — ED Notes (Signed)
[facility] lab courier called to transport labs to [facility] lab 

## 2023-08-07 NOTE — ED Notes (Signed)
Patient could not void . Rn will notify provider

## 2023-08-08 DIAGNOSIS — F250 Schizoaffective disorder, bipolar type: Secondary | ICD-10-CM | POA: Diagnosis not present

## 2023-08-08 LAB — POCT URINE DRUG SCREEN - MANUAL ENTRY (I-SCREEN)
POC Amphetamine UR: NOT DETECTED
POC Buprenorphine (BUP): NOT DETECTED
POC Cocaine UR: NOT DETECTED
POC Marijuana UR: POSITIVE — AB
POC Methadone UR: NOT DETECTED
POC Methamphetamine UR: NOT DETECTED
POC Morphine: NOT DETECTED
POC Oxazepam (BZO): POSITIVE — AB
POC Oxycodone UR: NOT DETECTED
POC Secobarbital (BAR): NOT DETECTED

## 2023-08-08 LAB — POC URINE PREG, ED: Preg Test, Ur: NEGATIVE

## 2023-08-08 MED ORDER — OLANZAPINE 5 MG PO TABS: 5.0000 mg | ORAL_TABLET | Freq: Every day | ORAL | 0 refills | Status: AC

## 2023-08-08 MED ORDER — HYDROXYZINE HCL 50 MG PO TABS: 50.0000 mg | ORAL_TABLET | Freq: Three times a day (TID) | ORAL | 0 refills | Status: AC | PRN

## 2023-08-08 MED ORDER — OLANZAPINE 2.5 MG PO TABS
2.5000 mg | ORAL_TABLET | Freq: Once | ORAL | Status: AC
  Administered 2023-08-08: 2.5 mg via ORAL
  Filled 2023-08-08: qty 1

## 2023-08-08 MED ORDER — TRAZODONE HCL 100 MG PO TABS: 100.0000 mg | ORAL_TABLET | Freq: Every day | ORAL | 0 refills | Status: AC

## 2023-08-08 MED ORDER — OLANZAPINE 2.5 MG PO TABS: 2.5000 mg | ORAL_TABLET | Freq: Every day | ORAL | 0 refills | Status: AC

## 2023-08-08 NOTE — ED Notes (Signed)
Pt sleeping at present, no distress noted.  Monitoring for safety. 

## 2023-08-08 NOTE — Discharge Instructions (Addendum)
Discharge recommendations:   Medications: Patient is to take medications as prescribed. The patient or patient's guardian is to contact a medical professional and/or outpatient provider to address any new side effects that develop. The patient or the patient's guardian should update outpatient providers of any new medications and/or medication changes.   Outpatient Follow up: Please follow up with [facility] for medication management and therapy.   Therapy: We recommend that patient participate in individual therapy to address mental health concerns.  Atypical antipsychotics: If you are prescribed an atypical antipsychotic, it is recommended that your height, weight, BMI, blood pressure, fasting lipid panel, and fasting blood sugar be monitored by your outpatient providers.  Safety:   The following safety precautions should be taken:   No sharp objects. This includes scissors, razors, scrapers, and putty knives.   Chemicals should be removed and locked up.   Medications should be removed and locked up.   Weapons should be removed and locked up. This includes firearms, knives and instruments that can be used to cause injury.   The patient should abstain from use of illicit substances/drugs and abuse of any medications.  If symptoms worsen or do not continue to improve or if the patient becomes actively suicidal or homicidal then it is recommended that the patient return to the closest hospital emergency department, the [facility], or call 911 for further evaluation and treatment. National Suicide Prevention Lifeline 1-800-SUICIDE or 579-696-3233.  About 988 988 offers 24/7 access to trained crisis counselors who can help people experiencing mental health-related distress. People can call or text 988 or chat 988lifeline.org for themselves or if they are worried about a loved one who may need crisis support.
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
## 2023-08-14 ENCOUNTER — Ambulatory Visit (HOSPITAL_COMMUNITY): Payer: No Typology Code available for payment source | Admitting: Psychiatry

## 2023-09-18 ENCOUNTER — Ambulatory Visit: Payer: No Typology Code available for payment source | Admitting: Internal Medicine

## 2023-09-28 ENCOUNTER — Emergency Department (HOSPITAL_COMMUNITY)
Admission: EM | Admit: 2023-09-28 | Discharge: 2023-09-28 | Disposition: A | Discharge status: Home / Self Care | Payer: No Typology Code available for payment source | Attending: Emergency Medicine | Admitting: Emergency Medicine

## 2023-09-28 ENCOUNTER — Other Ambulatory Visit: Payer: Self-pay

## 2023-09-28 DIAGNOSIS — Z23 Encounter for immunization: Secondary | ICD-10-CM | POA: Insufficient documentation

## 2023-09-28 DIAGNOSIS — Y990 Civilian activity done for income or pay: Secondary | ICD-10-CM | POA: Insufficient documentation

## 2023-09-28 DIAGNOSIS — S61211A Laceration without foreign body of left index finger without damage to nail, initial encounter: Secondary | ICD-10-CM | POA: Insufficient documentation

## 2023-09-28 DIAGNOSIS — W260XXA Contact with knife, initial encounter: Secondary | ICD-10-CM | POA: Insufficient documentation

## 2023-09-28 DIAGNOSIS — S6992XA Unspecified injury of left wrist, hand and finger(s), initial encounter: Secondary | ICD-10-CM | POA: Diagnosis present

## 2023-09-28 MED ORDER — TETANUS-DIPHTH-ACELL PERTUSSIS 5-2.5-18.5 LF-MCG/0.5 IM SUSY
0.5000 mL | PREFILLED_SYRINGE | Freq: Once | INTRAMUSCULAR | Status: AC
  Administered 2023-09-28: 0.5 mL via INTRAMUSCULAR
  Filled 2023-09-28: qty 0.5

## 2023-09-28 MED ORDER — BACITRACIN ZINC 500 UNIT/GM EX OINT
TOPICAL_OINTMENT | Freq: Once | CUTANEOUS | Status: AC
  Administered 2023-09-28: 1 via TOPICAL
  Filled 2023-09-28: qty 0.9

## 2023-09-28 NOTE — ED Provider Notes (Signed)
[place] EMERGENCY DEPARTMENT AT [facility] Provider Note   CSN: [id number] Arrival date & time: 09/28/23  2956     History  Chief Complaint  Patient presents with   Finger Injury    Sierra Ortiz is a 37 y.o. female.  Left index finger injury while cutting with a knife at work, lacerating the tip of the finger. No other injury. Tetanus status unknown.  The history is provided by the patient. No language interpreter was used.       Home Medications Prior to Admission medications   Medication Sig Start Date End Date Taking? Authorizing Provider  albuterol (VENTOLIN HFA) 108 (90 Base) MCG/ACT inhaler Inhale 1-2 puffs into the lungs every 6 (six) hours as needed for wheezing or shortness of breath. 04/08/23   Massengill, Harrold Donath, MD  fluticasone (FLONASE) 50 MCG/ACT nasal spray Place 2 sprays into both nostrils daily. 04/09/23   Massengill, Harrold Donath, MD  hydrocortisone (ANUSOL-HC) 2.5 % rectal cream Place rectally 2 (two) times daily as needed for hemorrhoids or anal itching. 04/08/23   Massengill, Harrold Donath, MD  hydrOXYzine (ATARAX) 50 MG tablet Take 1 tablet (50 mg total) by mouth 3 (three) times daily as needed for anxiety. 08/08/23   White, Patrice L, NP  melatonin 3 MG TABS tablet Take 1 tablet (3 mg total) by mouth at bedtime. 04/08/23   Massengill, Harrold Donath, MD  nicotine polacrilex (NICORETTE) 2 MG gum Take 1 each (2 mg total) by mouth as needed for smoking cessation. 04/08/23   Massengill, Harrold Donath, MD  OLANZapine (ZYPREXA) 2.5 MG tablet Take 1 tablet (2.5 mg total) by mouth daily. 08/08/23 09/07/23  White, Patrice L, NP  OLANZapine (ZYPREXA) 5 MG tablet Take 1 tablet (5 mg total) by mouth at bedtime. 08/08/23 09/07/23  White, Chrystine Oiler, NP  traZODone (DESYREL) 100 MG tablet Take 1 tablet (100 mg total) by mouth at bedtime. 08/08/23 09/07/23  Layla Barter, NP      Allergies    Haldol [haloperidol], Hydrocodone-acetaminophen, Vicodin [hydrocodone-acetaminophen], Aspirin, and  Penicillin v    Review of Systems   Review of Systems  Physical Exam Updated Vital Signs BP 121/84 (BP Location: Right Arm)   Pulse (!) 104   Temp 98.3 F (36.8 C) (Oral)   Resp 20   Ht 5\' 2"  (1.575 m)   Wt 52.2 kg   SpO2 100%   BMI 21.03 kg/m  Physical Exam Constitutional:      Appearance: Normal appearance.  Musculoskeletal:        General: Normal range of motion.     Comments: Lac to left index finger tip. Nail intact.   Skin:    Comments: Avulsion laceration across the tip of the finger. No active bleeding.  Neurological:     Mental Status: She is alert.     Sensory: No sensory deficit.     ED Results / Procedures / Treatments   Labs (all labs ordered are listed, but only abnormal results are displayed) Labs Reviewed - No data to display  EKG None  Radiology No results found.  Procedures Procedures    Medications Ordered in ED Medications  bacitracin ointment (has no administration in time range)  Tdap (BOOSTRIX) injection 0.5 mL (has no administration in time range)    ED Course/ Medical Decision Making/ A&P                                 Medical  Decision Making Laceration of the tip of the left index finger.   Amount and/or Complexity of Data Reviewed Discussion of management or test interpretation with external provider(s): Wound care provided. Tetanus updated. Care instructions discussed.   Risk OTC drugs.           Final Clinical Impression(s) / ED Diagnoses Final diagnoses:  Laceration of left index finger without foreign body without damage to nail, initial encounter    Rx / DC Orders ED Discharge Orders     None         Elpidio Anis, PA-C 09/28/23 9528    Margarita Grizzle, MD 09/29/23 1323

## 2023-09-28 NOTE — ED Triage Notes (Signed)
Pt cut the tip of her left index finger with a kitchen knife. Unknown last tetanus.

## 2023-09-28 NOTE — Discharge Instructions (Signed)
Follow wound care instructions. Tylenol and/or ibuprofen for pain.   Follow up with your doctor with any sign of infection.

## 2023-10-06 ENCOUNTER — Ambulatory Visit (HOSPITAL_COMMUNITY): Payer: No Typology Code available for payment source | Admitting: Mental Health

## 2024-05-14 ENCOUNTER — Ambulatory Visit
Admission: RE | Admit: 2024-05-14 | Discharge: 2024-05-14 | Disposition: A | Discharge status: Home / Self Care | Source: Ambulatory Visit | Attending: Nurse Practitioner | Admitting: Nurse Practitioner

## 2024-05-30 ENCOUNTER — Emergency Department (HOSPITAL_BASED_OUTPATIENT_CLINIC_OR_DEPARTMENT_OTHER)
Admission: EM | Admit: 2024-05-30 | Discharge: 2024-05-30 | Disposition: A | Discharge status: Home / Self Care | Attending: Emergency Medicine | Admitting: Emergency Medicine

## 2024-05-30 ENCOUNTER — Other Ambulatory Visit: Payer: Self-pay

## 2024-05-30 ENCOUNTER — Encounter (HOSPITAL_BASED_OUTPATIENT_CLINIC_OR_DEPARTMENT_OTHER): Payer: Self-pay

## 2024-05-30 ENCOUNTER — Emergency Department (HOSPITAL_BASED_OUTPATIENT_CLINIC_OR_DEPARTMENT_OTHER)

## 2024-05-30 DIAGNOSIS — J45909 Unspecified asthma, uncomplicated: Secondary | ICD-10-CM | POA: Diagnosis not present

## 2024-05-30 DIAGNOSIS — R1084 Generalized abdominal pain: Secondary | ICD-10-CM | POA: Diagnosis present

## 2024-05-30 DIAGNOSIS — D72829 Elevated white blood cell count, unspecified: Secondary | ICD-10-CM | POA: Diagnosis not present

## 2024-05-30 DIAGNOSIS — K654 Sclerosing mesenteritis: Secondary | ICD-10-CM | POA: Insufficient documentation

## 2024-05-30 DIAGNOSIS — R1032 Left lower quadrant pain: Secondary | ICD-10-CM

## 2024-05-30 LAB — CBC WITH DIFFERENTIAL/PLATELET
Abs Immature Granulocytes: 0.05 10*3/uL (ref 0.00–0.07)
Basophils Absolute: 0 10*3/uL (ref 0.0–0.1)
Basophils Relative: 0 %
Eosinophils Absolute: 0 10*3/uL (ref 0.0–0.5)
Eosinophils Relative: 0 %
HCT: 34.7 % — ABNORMAL LOW (ref 36.0–46.0)
Hemoglobin: 12.1 g/dL (ref 12.0–15.0)
Immature Granulocytes: 0 %
Lymphocytes Relative: 24 %
Lymphs Abs: 2.7 10*3/uL (ref 0.7–4.0)
MCH: 31.4 pg (ref 26.0–34.0)
MCHC: 34.9 g/dL (ref 30.0–36.0)
MCV: 90.1 fL (ref 80.0–100.0)
Monocytes Absolute: 0.7 10*3/uL (ref 0.1–1.0)
Monocytes Relative: 6 %
Neutro Abs: 7.7 10*3/uL (ref 1.7–7.7)
Neutrophils Relative %: 70 %
Platelets: 267 10*3/uL (ref 150–400)
RBC: 3.85 MIL/uL — ABNORMAL LOW (ref 3.87–5.11)
RDW: 13.2 % (ref 11.5–15.5)
WBC: 11.1 10*3/uL — ABNORMAL HIGH (ref 4.0–10.5)
nRBC: 0 % (ref 0.0–0.2)

## 2024-05-30 LAB — URINALYSIS, ROUTINE W REFLEX MICROSCOPIC
Glucose, UA: NEGATIVE mg/dL
Leukocytes,Ua: NEGATIVE
Nitrite: NEGATIVE
Specific Gravity, Urine: 1.038 — ABNORMAL HIGH (ref 1.005–1.030)
pH: 6 (ref 5.0–8.0)

## 2024-05-30 LAB — COMPREHENSIVE METABOLIC PANEL WITH GFR
ALT: 22 U/L (ref 0–44)
AST: 25 U/L (ref 15–41)
Albumin: 4.2 g/dL (ref 3.5–5.0)
Alkaline Phosphatase: 54 U/L (ref 38–126)
Anion gap: 13 (ref 5–15)
BUN: 11 mg/dL (ref 6–20)
CO2: 22 mmol/L (ref 22–32)
Calcium: 9.3 mg/dL (ref 8.9–10.3)
Chloride: 102 mmol/L (ref 98–111)
Creatinine, Ser: 0.75 mg/dL (ref 0.44–1.00)
GFR, Estimated: >60 mL/min (ref >60)
Glucose, Bld: 103 mg/dL — ABNORMAL HIGH (ref 70–99)
Potassium: 4 mmol/L (ref 3.5–5.1)
Sodium: 137 mmol/L (ref 135–145)
Total Bilirubin: 0.3 mg/dL (ref 0.0–1.2)
Total Protein: 7.2 g/dL (ref 6.5–8.1)

## 2024-05-30 LAB — LIPASE, BLOOD: Lipase: 16 U/L (ref 11–51)

## 2024-05-30 MED ORDER — PRUCALOPRIDE SUCCINATE 2 MG PO TABS: 1.0000 | ORAL_TABLET | Freq: Every day | ORAL | 0 refills | Status: AC

## 2024-05-30 MED ORDER — OXYCODONE HCL 5 MG PO TABS: 2.5000 mg | ORAL_TABLET | Freq: Four times a day (QID) | ORAL | 0 refills | Status: AC | PRN

## 2024-05-30 MED ORDER — NAPROXEN 375 MG PO TABS: 375.0000 mg | ORAL_TABLET | Freq: Two times a day (BID) | ORAL | 0 refills | Status: AC

## 2024-05-30 MED ORDER — FENTANYL CITRATE PF 50 MCG/ML IJ SOSY
50.0000 ug | PREFILLED_SYRINGE | Freq: Once | INTRAMUSCULAR | Status: AC
  Administered 2024-05-30: 50 ug via INTRAVENOUS
  Filled 2024-05-30: qty 1

## 2024-05-30 MED ORDER — ONDANSETRON HCL 4 MG/2ML IJ SOLN
4.0000 mg | Freq: Once | INTRAMUSCULAR | Status: AC
  Administered 2024-05-30: 4 mg via INTRAVENOUS
  Filled 2024-05-30: qty 2

## 2024-05-30 MED ORDER — HYDROMORPHONE HCL 1 MG/ML IJ SOLN
0.5000 mg | Freq: Once | INTRAMUSCULAR | Status: AC
  Administered 2024-05-30: 0.5 mg via INTRAVENOUS
  Filled 2024-05-30: qty 1

## 2024-05-30 MED ORDER — ONDANSETRON 4 MG PO TBDP: 4.0000 mg | ORAL_TABLET | Freq: Three times a day (TID) | ORAL | 0 refills | Status: AC | PRN

## 2024-05-30 MED ORDER — IOHEXOL 300 MG/ML  SOLN
100.0000 mL | Freq: Once | INTRAMUSCULAR | Status: AC | PRN
  Administered 2024-05-30: 80 mL via INTRAVENOUS

## 2024-05-30 NOTE — ED Triage Notes (Signed)
 Pt to triage with steady gait for worsening LLQ pain, constipation, and unable to pass gas x3 days. Pt rates pain 9/10 denies any radiation. Reports nausea and intermittent vomiting. PMHx of peptic ulcer, GERD, and new dx of HSV2.

## 2024-05-30 NOTE — ED Provider Notes (Signed)
 [place] EMERGENCY DEPARTMENT AT [facility] Provider Note   CSN: [id number] Arrival date & time: 05/30/24  2041     Patient presents with: Abdominal Pain (LLQ)   Sierra Ortiz is a 38 y.o. female who  has a past medical history of Asthma, Gastric ulcer, and IBS (irritable bowel syndrome). She presents to the er with cc of generalized abd pain worse in the LLQ . She reports a hx of chronic abd pain, PUD and reports previous ruptured ovarian cysts. She c/o severe pain 9/10 , intermittent nausea and vomiting.She has been constipated and  not even passing gas for the past 3 days . 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 Denies fever or current vaginal/ urinary sxs.    Abdominal Pain      Prior to Admission medications   Medication Sig Start Date End Date Taking? Authorizing Provider  naproxen  (NAPROSYN ) 375 MG tablet Take 1 tablet (375 mg total) by mouth 2 (two) times daily with a meal. 05/30/24  Yes Krayton Wortley, PA-C  ondansetron  (ZOFRAN -ODT) 4 MG disintegrating tablet Take 1 tablet (4 mg total) by mouth every 8 (eight) hours as needed for nausea or vomiting. 05/30/24  Yes Marshelle Bilger, PA-C  oxyCODONE  (ROXICODONE ) 5 MG immediate release tablet Take 0.5-1 tablets (2.5-5 mg total) by mouth every 6 (six) hours as needed for severe pain (pain score 7-10). 05/30/24  Yes Tahjae Clausing, PA-C  Prucalopride Succinate  (MOTEGRITY ) 2 MG TABS Take 1 tablet (2 mg total) by mouth daily. 05/30/24  Yes La Shehan, PA-C  albuterol  (VENTOLIN  HFA) 108 (90 Base) MCG/ACT inhaler Inhale 1-2 puffs into the lungs every 6 (six) hours as needed for wheezing or shortness of breath. 04/08/23   Massengill, Rankin, MD  fluticasone  (FLONASE ) 50 MCG/ACT nasal spray Place 2 sprays into both nostrils daily. 04/09/23   Massengill, Rankin, MD  hydrocortisone  (ANUSOL -HC) 2.5 %  rectal cream Place rectally 2 (two) times daily as needed for hemorrhoids or anal itching. 04/08/23   Massengill, Rankin, MD  hydrOXYzine  (ATARAX ) 50 MG tablet Take 1 tablet (50 mg total) by mouth 3 (three) times daily as needed for anxiety. 08/08/23   White, Patrice L, NP  melatonin 3 MG TABS tablet Take 1 tablet (3 mg total) by mouth at bedtime. 04/08/23   Massengill, Rankin, MD  nicotine  polacrilex (NICORETTE ) 2 MG gum Take 1 each (2 mg total) by mouth as needed for smoking cessation. 04/08/23   Massengill, Rankin, MD  OLANZapine  (ZYPREXA ) 2.5 MG tablet Take 1 tablet (2.5 mg total) by mouth daily. 08/08/23 09/07/23  White, Patrice L, NP  OLANZapine  (ZYPREXA ) 5 MG tablet Take 1 tablet (5 mg total) by mouth at bedtime. 08/08/23 09/07/23  White, Patrice L, NP  traZODone  (DESYREL ) 100 MG tablet Take 1 tablet (100 mg total) by mouth at bedtime. 08/08/23 09/07/23  Teresa Wyline CROME, NP    Allergies: Haldol  [haloperidol ], Hydrocodone -acetaminophen , Vicodin [hydrocodone -acetaminophen ], Aspirin, and Penicillin v    Review of Systems  Gastrointestinal:  Positive for abdominal pain.    Updated Vital Signs BP 104/73 (BP Location: Right Arm)   Pulse 73   Temp 98.5 F (36.9 C) (Oral)   Resp 17   SpO2 98%   Physical Exam Vitals and nursing note reviewed.  Constitutional:      General: She is not in acute distress.    Appearance: She  is well-developed. She is not diaphoretic.     Comments: Appears uncomfortable  HENT:     Head: Normocephalic and atraumatic.     Right Ear: External ear normal.     Left Ear: External ear normal.     Nose: Nose normal.     Mouth/Throat:     Mouth: Mucous membranes are moist.   Eyes:     General: No scleral icterus.    Conjunctiva/sclera: Conjunctivae normal.    Cardiovascular:     Rate and Rhythm: Normal rate and regular rhythm.     Heart sounds: Normal heart sounds. No murmur heard.    No friction rub. No gallop.  Pulmonary:     Effort: Pulmonary effort is  normal. No respiratory distress.     Breath sounds: Normal breath sounds.  Abdominal:     General: Bowel sounds are normal. There is no distension.     Palpations: Abdomen is soft. There is no mass.     Tenderness: There is generalized abdominal tenderness. There is no guarding or rebound.   Musculoskeletal:     Cervical back: Normal range of motion.   Skin:    General: Skin is warm and dry.   Neurological:     Mental Status: She is alert and oriented to person, place, and time.   Psychiatric:        Behavior: Behavior normal.     (all labs ordered are listed, but only abnormal results are displayed) Labs Reviewed  CBC WITH DIFFERENTIAL/PLATELET - Abnormal; Notable for the following components:      Result Value   WBC 11.1 (*)    RBC 3.85 (*)    HCT 34.7 (*)    All other components within normal limits  COMPREHENSIVE METABOLIC PANEL WITH GFR - Abnormal; Notable for the following components:   Glucose, Bld 103 (*)    All other components within normal limits  URINALYSIS, ROUTINE W REFLEX MICROSCOPIC - Abnormal; Notable for the following components:   Specific Gravity, Urine 1.038 (*)    Hgb urine dipstick TRACE (*)    Bilirubin Urine SMALL (*)    Ketones, ur TRACE (*)    Protein, ur TRACE (*)    Bacteria, UA RARE (*)    All other components within normal limits  LIPASE, BLOOD    EKG: None  Radiology: CT ABDOMEN PELVIS W CONTRAST Result Date: 05/30/2024 CLINICAL DATA:  Left lower quadrant pain. EXAM: CT ABDOMEN AND PELVIS WITH CONTRAST TECHNIQUE: Multidetector CT imaging of the abdomen and pelvis was performed using the standard protocol following bolus administration of intravenous contrast. RADIATION DOSE REDUCTION: This exam was performed according to the departmental dose-optimization program which includes automated exposure control, adjustment of the mA and/or kV according to patient size and/or use of iterative reconstruction technique. CONTRAST:  80mL OMNIPAQUE   IOHEXOL  300 MG/ML  SOLN COMPARISON:  April 12, 2021 and September 07, 2020 FINDINGS: Lower chest: No acute abnormality. Hepatobiliary: No focal liver abnormality is seen. No gallstones, gallbladder wall thickening, or biliary dilatation. Pancreas: Unremarkable. No pancreatic ductal dilatation or surrounding inflammatory changes. Spleen: Normal in size without focal abnormality. Adrenals/Urinary Tract: Adrenal glands are unremarkable. Kidneys are normal, without renal calculi, focal lesion, or hydronephrosis. Bladder is unremarkable. Stomach/Bowel: Stomach is within normal limits. The appendix is not clearly identified. No evidence of bowel wall thickening, distention, or inflammatory changes. Vascular/Lymphatic: Aortic atherosclerosis. No enlarged abdominal or pelvic lymph nodes. A predominantly stable 2.5 cm x 0.9 cm x 2.5 cm area  of soft tissue attenuation (approximately 11.17 Hounsfield units) is seen within the mesentery of the mid to lower abdomen, to the left of midline (axial CT images 35 through 42, CT series 2/coronal reformatted images 57 through 62, CT series 4). Reproductive: Status post hysterectomy. A 15 mm diameter simple cyst is seen within the left adnexa. The right adnexa is unremarkable. Other: No abdominal wall hernia or abnormality. No abdominopelvic ascites. Musculoskeletal: No acute or significant osseous findings. IMPRESSION: 1. No acute or active process within the abdomen or pelvis. 2. Predominantly stable, likely benign area of soft tissue attenuation within the mesentery of the mid to lower abdomen, to the left of midline. 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 3. 15 mm diameter simple cyst within the left adnexa, likely ovarian in origin. No follow-up imaging is recommended. This recommendation follows ACR consensus guidelines: White Paper of the ACR Incidental Findings Committee II on Adnexal Findings. J Am Coll Radiol 2013:10:675-681. 4. Aortic  atherosclerosis. Electronically Signed   By: Suzen Dials M.D.   On: 05/30/2024 22:32     Procedures   Medications Ordered in the ED  fentaNYL  (SUBLIMAZE ) injection 50 mcg (50 mcg Intravenous Given 05/30/24 2127)  ondansetron  (ZOFRAN ) injection 4 mg (4 mg Intravenous Given 05/30/24 2125)  iohexol  (OMNIPAQUE ) 300 MG/ML solution 100 mL (80 mLs Intravenous Contrast Given 05/30/24 2222)  HYDROmorphone  (DILAUDID ) injection 0.5 mg (0.5 mg Intravenous Given 05/30/24 2245)                                    Medical Decision Making This patient presents to the ED for concern of abdominal pain, this involves an extensive number of treatment options, and is a complaint that carries with it a high risk of complications and morbidity.  The differential diagnosis for generalized abdominal pain includes, but is not limited to AAA, gastroenteritis, appendicitis, Bowel obstruction, Bowel perforation. Gastroparesis, DKA, Hernia, Inflammatory bowel disease, mesenteric ischemia, pancreatitis, peritonitis SBP, volvulus.    Co morbidities:      per hpi  Social Determinants of Health:        SDOH Screenings Food Insecurity: No Food Insecurity (04/26/2024)     Received from [facility] Housing: Low Risk  (04/26/2024)     Received from [facility] Transportation Needs: No Transportation Needs (04/26/2024)     Received from [facility] Utilities: Not At Risk (04/26/2024)     Received from [facility] Alcohol Screen: Low Risk  (04/01/2023) Financial Resource Strain: Medium Risk (04/26/2024)     Received from [facility] Physical Activity: Sufficiently Active (04/26/2024)     Received from [facility] Social Connections: Somewhat Isolated (04/26/2024)     Received from [facility] Stress: Stress Concern Present (04/26/2024)     Received from [facility] Tobacco Use: High Risk (05/30/2024)   Additional history:  {Additional history obtained from patient and mother at bedside {External  records from outside source obtained and reviewed including previous labs  Lab Tests:  I Ordered, and personally interpreted labs.  The pertinent results include:    Wbc 11.1 likely APR Urine appears contaminated- no urinary xsx  Imaging Studies:  I ordered imaging studies including CT abd and pelvis I independently visualized and interpreted imaging which showed bening physiologic cyst L ovary- signs of chronic mesenteric panniculitis I agree with the radiologist interpretation  Cardiac Monitoring/ECG:   Medicines ordered and prescription drug management:  I ordered medication including Medications fentaNYL  (SUBLIMAZE ) injection 50 mcg (50 mcg Intravenous Given 05/30/24 2127) ondansetron  (ZOFRAN ) injection 4 mg (4 mg Intravenous Given 05/30/24 2125) iohexol  (OMNIPAQUE ) 300 MG/ML solution 100 mL (80 mLs Intravenous Contrast Given 05/30/24 2222) HYDROmorphone  (DILAUDID ) injection 0.5 mg (0.5 mg Intravenous Given 05/30/24 2245) for pain Reevaluation of the patient after these medicines showed that the patient resolved I have reviewed the patients home medicines and have made adjustments as needed  Test Considered:       I considered repeat pelvic exam and / or US  of pelvis r/o Torsion, no swelling or swirling on CT suggestive of torsion.   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 Signs of potential chronic mesenteric panniculitis PDMP reviewed during this encounter. Pain is resolved - no active vomiting, signs of obsrruction  Tx: Allergies as of 05/30/2024      Reactions   Haldol  [haloperidol ]    Dystonic reaction   Hydrocodone -acetaminophen  Nausea And Vomiting   Patient reports allergy to the Hydrocodone  portion of Vicodin.  She has  taken and tolerated  Tylenol  in the past.   Vicodin [hydrocodone -acetaminophen ] Nausea And Vomiting   Patient reports allergy to the Hydrocodone  portion of Vicodin.  She has  taken and tolerated Tylenol  in the past.   Aspirin Other (See Comments), Nausea And Vomiting   Makes me talk out of my head. Other reaction(s): Dizziness (intolerance) Makes me talk out of my head.   Penicillin V    Other reaction(s): Dizziness (intolerance), yeast infection      Medication List    TAKE these medications   naproxen  375 MG tablet Commonly known as: NAPROSYN  Take 1 tablet (375 mg total) by mouth 2 (two) times daily with a meal.   ondansetron  4 MG disintegrating tablet Commonly known as: ZOFRAN -ODT Take 1 tablet (4 mg total) by mouth every 8 (eight) hours as needed for  nausea or vomiting.   oxyCODONE  5 MG immediate release tablet Commonly known as: Roxicodone  Take 0.5-1 tablets (2.5-5 mg total) by mouth every 6 (six) hours as needed  for severe pain (pain score 7-10).   Prucalopride Succinate  2 MG Tabs Commonly known as: Motegrity  Take 1 tablet (2 mg total) by mouth daily. Discharge with op GI follow up  Strict return precautions    Amount and/or Complexity of Data Reviewed Labs: ordered. Radiology: ordered.  Risk Prescription drug management.        Final diagnoses:  Left lower quadrant abdominal pain  Mesenteric panniculitis [facility])    ED Discharge Orders          Ordered    Prucalopride Succinate  (MOTEGRITY ) 2 MG TABS  Daily        05/30/24 2309    oxyCODONE  (ROXICODONE ) 5 MG immediate release tablet  Every 6 hours PRN        05/30/24 2309    naproxen  (NAPROSYN ) 375 MG tablet  2 times daily with meals        05/30/24 2309    ondansetron  (ZOFRAN -ODT) 4 MG disintegrating tablet  Every 8 hours PRN        05/30/24 2309               Arloa Chroman, PA-C 06/01/24 1407  Doretha Folks, MD 06/06/24 2039

## 2024-05-30 NOTE — Discharge Instructions (Signed)

## 2024-06-02 NOTE — Care Management (Signed)
 Received call from [facility] concerning Prior auth unable to provide prior Auth EDP is not available. Rep will f/u with PCP. No further needs identified.

## 2024-06-07 ENCOUNTER — Other Ambulatory Visit: Payer: Self-pay

## 2024-06-07 ENCOUNTER — Encounter (HOSPITAL_BASED_OUTPATIENT_CLINIC_OR_DEPARTMENT_OTHER): Payer: Self-pay | Admitting: Emergency Medicine

## 2024-06-07 DIAGNOSIS — K5900 Constipation, unspecified: Secondary | ICD-10-CM | POA: Diagnosis not present

## 2024-06-07 DIAGNOSIS — R1084 Generalized abdominal pain: Secondary | ICD-10-CM | POA: Diagnosis present

## 2024-06-07 NOTE — ED Triage Notes (Signed)
 Patient coming to ED for evaluation of abdominal pain and constipation x 1 week.  Was seen recently and CT was concerning for mesenteric panniculitis. Has tried multiple OTC medication and Rx medication to help with constipation.  No relief.  Reports last BM was one week ago.  Abdominal distention noted.

## 2024-06-08 ENCOUNTER — Emergency Department (HOSPITAL_BASED_OUTPATIENT_CLINIC_OR_DEPARTMENT_OTHER)
Admission: EM | Admit: 2024-06-08 | Discharge: 2024-06-08 | Disposition: A | Discharge status: Home / Self Care | Attending: Emergency Medicine | Admitting: Emergency Medicine

## 2024-06-08 DIAGNOSIS — K5900 Constipation, unspecified: Secondary | ICD-10-CM

## 2024-06-08 DIAGNOSIS — R1084 Generalized abdominal pain: Secondary | ICD-10-CM

## 2024-06-08 LAB — COMPREHENSIVE METABOLIC PANEL WITH GFR
ALT: 11 U/L (ref 0–44)
AST: 16 U/L (ref 15–41)
Albumin: 4.4 g/dL (ref 3.5–5.0)
Alkaline Phosphatase: 51 U/L (ref 38–126)
Anion gap: 11 (ref 5–15)
BUN: 13 mg/dL (ref 6–20)
CO2: 24 mmol/L (ref 22–32)
Calcium: 9.9 mg/dL (ref 8.9–10.3)
Chloride: 103 mmol/L (ref 98–111)
Creatinine, Ser: 0.89 mg/dL (ref 0.44–1.00)
GFR, Estimated: >60 mL/min (ref >60)
Glucose, Bld: 97 mg/dL (ref 70–99)
Potassium: 4.4 mmol/L (ref 3.5–5.1)
Sodium: 138 mmol/L (ref 135–145)
Total Bilirubin: 0.3 mg/dL (ref 0.0–1.2)
Total Protein: 7.5 g/dL (ref 6.5–8.1)

## 2024-06-08 LAB — CBC
HCT: 36.3 % (ref 36.0–46.0)
Hemoglobin: 12.4 g/dL (ref 12.0–15.0)
MCH: 31.6 pg (ref 26.0–34.0)
MCHC: 34.2 g/dL (ref 30.0–36.0)
MCV: 92.6 fL (ref 80.0–100.0)
Platelets: 227 10*3/uL (ref 150–400)
RBC: 3.92 MIL/uL (ref 3.87–5.11)
RDW: 13.8 % (ref 11.5–15.5)
WBC: 7.6 10*3/uL (ref 4.0–10.5)
nRBC: 0 % (ref 0.0–0.2)

## 2024-06-08 LAB — URINALYSIS, ROUTINE W REFLEX MICROSCOPIC
Bilirubin Urine: NEGATIVE
Glucose, UA: NEGATIVE mg/dL
Hgb urine dipstick: NEGATIVE
Ketones, ur: NEGATIVE mg/dL
Nitrite: NEGATIVE
Specific Gravity, Urine: 1.03 (ref 1.005–1.030)
pH: 5.5 (ref 5.0–8.0)

## 2024-06-08 LAB — LIPASE, BLOOD: Lipase: 18 U/L (ref 11–51)

## 2024-06-08 NOTE — ED Provider Notes (Signed)
 [place] EMERGENCY DEPARTMENT AT [facility]  Provider Note  CSN: [id number] Arrival date & time: 06/07/24 2301  History Chief Complaint  Patient presents with   Abdominal Pain    Kirsta  Ortiz is a 38 y.o. female reports over a week of constipation, despite multiple OTC and prescription medications. She has had some abdominal bloating. No vomiting. Seen in the ED for abdominal pain on 6/15 and had CT showing chronic mesenteric panniculitis, but otherwise normal. She has GI but next appointment was next month. She was prescribed a medication by them, but unable to get filled due to prior authorization.    Home Medications Prior to Admission medications   Medication Sig Start Date End Date Taking? Authorizing Provider  albuterol  (VENTOLIN  HFA) 108 (90 Base) MCG/ACT inhaler Inhale 1-2 puffs into the lungs every 6 (six) hours as needed for wheezing or shortness of breath. 04/08/23   Massengill, Rankin, MD  fluticasone  (FLONASE ) 50 MCG/ACT nasal spray Place 2 sprays into both nostrils daily. 04/09/23   Massengill, Rankin, MD  hydrocortisone  (ANUSOL -HC) 2.5 % rectal cream Place rectally 2 (two) times daily as needed for hemorrhoids or anal itching. 04/08/23   Massengill, Rankin, MD  hydrOXYzine  (ATARAX ) 50 MG tablet Take 1 tablet (50 mg total) by mouth 3 (three) times daily as needed for anxiety. 08/08/23   White, Patrice L, NP  melatonin 3 MG TABS tablet Take 1 tablet (3 mg total) by mouth at bedtime. 04/08/23   Massengill, Rankin, MD  naproxen  (NAPROSYN ) 375 MG tablet Take 1 tablet (375 mg total) by mouth 2 (two) times daily with a meal. 05/30/24   Arloa Chroman, PA-C  nicotine  polacrilex (NICORETTE ) 2 MG gum Take 1 each (2 mg total) by mouth as needed for smoking cessation. 04/08/23   Massengill, Rankin, MD  OLANZapine  (ZYPREXA ) 2.5 MG tablet Take 1 tablet (2.5 mg total) by mouth daily. 08/08/23 09/07/23  White, Patrice L, NP  OLANZapine  (ZYPREXA ) 5 MG tablet Take 1 tablet (5 mg total)  by mouth at bedtime. 08/08/23 09/07/23  White, Wyline CROME, NP  ondansetron  (ZOFRAN -ODT) 4 MG disintegrating tablet Take 1 tablet (4 mg total) by mouth every 8 (eight) hours as needed for nausea or vomiting. 05/30/24   Harris, Abigail, PA-C  oxyCODONE  (ROXICODONE ) 5 MG immediate release tablet Take 0.5-1 tablets (2.5-5 mg total) by mouth every 6 (six) hours as needed for severe pain (pain score 7-10). 05/30/24   Harris, Abigail, PA-C  Prucalopride Succinate  (MOTEGRITY ) 2 MG TABS Take 1 tablet (2 mg total) by mouth daily. 05/30/24   Harris, Abigail, PA-C  traZODone  (DESYREL ) 100 MG tablet Take 1 tablet (100 mg total) by mouth at bedtime. 08/08/23 09/07/23  Teresa Wyline CROME, NP     Allergies    Haldol  [haloperidol ], Hydrocodone -acetaminophen , Vicodin [hydrocodone -acetaminophen ], Aspirin, and Penicillin v   Review of Systems   Review of Systems Please see HPI for pertinent positives and negatives  Physical Exam BP 118/77 (BP Location: Right Arm)   Pulse (!) 105   Temp 97.8 F (36.6 C)   Resp 18   Ht 5' 2 (1.575 m)   Wt 53.5 kg   SpO2 100%   BMI 21.58 kg/m   Physical Exam Vitals and nursing note reviewed.  Constitutional:      Appearance: Normal appearance.  HENT:     Head: Normocephalic and atraumatic.     Nose: Nose normal.     Mouth/Throat:     Mouth: Mucous membranes are moist.   Eyes:  Extraocular Movements: Extraocular movements intact.     Conjunctiva/sclera: Conjunctivae normal.    Cardiovascular:     Rate and Rhythm: Normal rate.  Pulmonary:     Effort: Pulmonary effort is normal.     Breath sounds: Normal breath sounds.  Abdominal:     General: Abdomen is protuberant.     Palpations: Abdomen is soft.     Tenderness: There is generalized abdominal tenderness. There is no guarding. Negative signs include Murphy's sign and McBurney's sign.   Musculoskeletal:        General: No swelling. Normal range of motion.     Cervical back: Neck supple.   Skin:    General:  Skin is warm and dry.   Neurological:     General: No focal deficit present.     Mental Status: She is alert.   Psychiatric:        Mood and Affect: Mood normal.     ED Results / Procedures / Treatments   EKG None  Procedures Procedures  Medications Ordered in the ED Medications - No data to display  Initial Impression and Plan  Patient here with continued abdominal pain and constipation. Abdomen is benign without peritoneal signs. Labs done in triage show unremarkable CBC, CMP, lipase and UA. No concern for acute surgical intraabdominal process. Recommend she try Mag citrate and higher doses of miralax at home and follow up with GI for long term management.   ED Course       MDM Rules/Calculators/A&P Medical Decision Making Problems Addressed: Constipation, unspecified constipation type: chronic illness or injury with exacerbation, progression, or side effects of treatment Generalized abdominal pain: chronic illness or injury with exacerbation, progression, or side effects of treatment  Amount and/or Complexity of Data Reviewed Labs: ordered. Decision-making details documented in ED Course.  Risk OTC drugs.     Final Clinical Impression(s) / ED Diagnoses Final diagnoses:  Constipation, unspecified constipation type  Generalized abdominal pain    Rx / DC Orders ED Discharge Orders     None        Roselyn Carlin NOVAK, MD 06/08/24 0126

## 2024-06-08 NOTE — Discharge Instructions (Addendum)
 Please try using Magnesium  Citrate and increased doses of Miralax to relieve your constipation.

## 2024-06-08 NOTE — ED Notes (Signed)
 RN reviewed discharge instructions with pt. Pt verbalized understanding and had no further questions. VSS upon discharge.

## 2024-11-24 ENCOUNTER — Emergency Department (HOSPITAL_COMMUNITY)

## 2024-11-24 ENCOUNTER — Encounter (HOSPITAL_COMMUNITY): Payer: Self-pay

## 2024-11-24 ENCOUNTER — Emergency Department (HOSPITAL_COMMUNITY)
Admission: EM | Admit: 2024-11-24 | Discharge: 2024-11-24 | Disposition: A | Discharge status: Home / Self Care | Attending: Emergency Medicine | Admitting: Emergency Medicine

## 2024-11-24 ENCOUNTER — Other Ambulatory Visit: Payer: Self-pay

## 2024-11-24 DIAGNOSIS — R739 Hyperglycemia, unspecified: Secondary | ICD-10-CM | POA: Insufficient documentation

## 2024-11-24 DIAGNOSIS — J45909 Unspecified asthma, uncomplicated: Secondary | ICD-10-CM | POA: Insufficient documentation

## 2024-11-24 DIAGNOSIS — R110 Nausea: Secondary | ICD-10-CM | POA: Insufficient documentation

## 2024-11-24 DIAGNOSIS — F17200 Nicotine dependence, unspecified, uncomplicated: Secondary | ICD-10-CM | POA: Insufficient documentation

## 2024-11-24 DIAGNOSIS — Z7951 Long term (current) use of inhaled steroids: Secondary | ICD-10-CM | POA: Insufficient documentation

## 2024-11-24 LAB — URINALYSIS, ROUTINE W REFLEX MICROSCOPIC
Bilirubin Urine: NEGATIVE
Glucose, UA: NEGATIVE mg/dL
Hgb urine dipstick: NEGATIVE
Ketones, ur: 80 mg/dL — AB
Leukocytes,Ua: NEGATIVE
Nitrite: NEGATIVE
Protein, ur: NEGATIVE mg/dL
Specific Gravity, Urine: <1.005 — ABNORMAL LOW (ref 1.005–1.030)
pH: 6 (ref 5.0–8.0)

## 2024-11-24 LAB — CBC
HCT: 36.1 % (ref 36.0–46.0)
Hemoglobin: 12.5 g/dL (ref 12.0–15.0)
MCH: 31.6 pg (ref 26.0–34.0)
MCHC: 34.6 g/dL (ref 30.0–36.0)
MCV: 91.4 fL (ref 80.0–100.0)
Platelets: 288 K/uL (ref 150–400)
RBC: 3.95 MIL/uL (ref 3.87–5.11)
RDW: 13.1 % (ref 11.5–15.5)
WBC: 8 K/uL (ref 4.0–10.5)
nRBC: 0 % (ref 0.0–0.2)

## 2024-11-24 LAB — COMPREHENSIVE METABOLIC PANEL WITH GFR
ALT: 9 U/L (ref 0–44)
AST: 16 U/L (ref 15–41)
Albumin: 4.7 g/dL (ref 3.5–5.0)
Alkaline Phosphatase: 55 U/L (ref 38–126)
Anion gap: 13 (ref 5–15)
BUN: 5 mg/dL — ABNORMAL LOW (ref 6–20)
CO2: 22 mmol/L (ref 22–32)
Calcium: 9.4 mg/dL (ref 8.9–10.3)
Chloride: 101 mmol/L (ref 98–111)
Creatinine, Ser: 0.67 mg/dL (ref 0.44–1.00)
GFR, Estimated: >60 mL/min (ref >60)
Glucose, Bld: 119 mg/dL — ABNORMAL HIGH (ref 70–99)
Potassium: 3.8 mmol/L (ref 3.5–5.1)
Sodium: 136 mmol/L (ref 135–145)
Total Bilirubin: 0.6 mg/dL (ref 0.0–1.2)
Total Protein: 8.2 g/dL — ABNORMAL HIGH (ref 6.5–8.1)

## 2024-11-24 LAB — LIPASE, BLOOD: Lipase: <10 U/L — ABNORMAL LOW (ref 11–51)

## 2024-11-24 MED ORDER — IOHEXOL 300 MG/ML  SOLN
100.0000 mL | Freq: Once | INTRAMUSCULAR | Status: AC | PRN
  Administered 2024-11-24: 100 mL via INTRAVENOUS

## 2024-11-24 MED ORDER — ONDANSETRON HCL 4 MG/2ML IJ SOLN
4.0000 mg | Freq: Once | INTRAMUSCULAR | Status: AC
  Administered 2024-11-24: 4 mg via INTRAVENOUS
  Filled 2024-11-24: qty 2

## 2024-11-24 MED ORDER — SODIUM CHLORIDE 0.9 % IV BOLUS
1000.0000 mL | Freq: Once | INTRAVENOUS | Status: AC
  Administered 2024-11-24: 1000 mL via INTRAVENOUS

## 2024-11-24 MED ORDER — ONDANSETRON 4 MG PO TBDP: 4.0000 mg | ORAL_TABLET | Freq: Three times a day (TID) | ORAL | 0 refills | Status: AC | PRN

## 2024-11-24 NOTE — ED Notes (Signed)
 Patient transported to CT

## 2024-11-24 NOTE — Discharge Instructions (Signed)
 It was a pleasure taking care of you today.  As discussed, your workup was reassuring.  CT abdomen did not show any acute abnormalities.  It did demonstrate some material in your mesentery which has been present since 2021.  Please follow-up with GI doctor for further evaluation.  I am sending you home with nausea medication.  Take as needed.  Return to the ER for any worsening symptoms.

## 2024-11-24 NOTE — ED Notes (Signed)
 Pt went to bathroom and didn't get urine cup in time.

## 2024-11-24 NOTE — ED Notes (Signed)
 Pt given 8oz of Soda and Plain crackers for PO challenge. Pt was able to keep down same with no issues.

## 2024-11-24 NOTE — ED Triage Notes (Signed)
 Patient has been exposed to mold (unknown how long she has been living in it). Feels that she cannot even eat apple sauce. Constant nausea, has burning eyes, constant headache.

## 2024-11-24 NOTE — ED Provider Notes (Signed)
 [place] [facility]MENT AT [facility] Provider Note   CSN: [id number] Arrival date & time: 11/24/24  9049     Patient presents with: Nausea   Sierra  Ortiz is a 38 y.o. female with a past medical history significant for bipolar disorder, marijuana use, GERD, and asthma who presents to the ED due to persistent nausea and decreased p.o. intake.  She notes she recently found mold in her home which she contributes to her symptoms.  Landlord currently working to remedy the situation.  Notes she has had constant nausea with any p.o. intake over the past 1.5 months.  Has a CT abdomen scheduled for next week. Admits to intermittent heavy breathing. Denies chest pain. No fever or chills. Denies vomiting and diarrhea. Also endorses burning of the eyes and intermittent headache. No visual or speech changes.   History obtained from patient and past medical records. No interpreter used during encounter.       Prior to Admission medications   Medication Sig Start Date End Date Taking? Authorizing Provider  ondansetron  (ZOFRAN -ODT) 4 MG disintegrating tablet Take 1 tablet (4 mg total) by mouth every 8 (eight) hours as needed. 11/24/24  Yes Tamasha Laplante, Aleck BROCKS, PA-C  albuterol  (VENTOLIN  HFA) 108 (90 Base) MCG/ACT inhaler Inhale 1-2 puffs into the lungs every 6 (six) hours as needed for wheezing or shortness of breath. 04/08/23   Massengill, Rankin, MD  fluticasone  (FLONASE ) 50 MCG/ACT nasal spray Place 2 sprays into both nostrils daily. 04/09/23   Massengill, Rankin, MD  hydrocortisone  (ANUSOL -HC) 2.5 % rectal cream Place rectally 2 (two) times daily as needed for hemorrhoids or anal itching. 04/08/23   Massengill, Rankin, MD  hydrOXYzine  (ATARAX ) 50 MG tablet Take 1 tablet (50 mg total) by mouth 3 (three) times daily as needed for anxiety. 08/08/23   White, Patrice L, NP  melatonin 3 MG TABS tablet Take 1 tablet (3 mg total) by mouth at bedtime. 04/08/23   Massengill, Rankin, MD   naproxen  (NAPROSYN ) 375 MG tablet Take 1 tablet (375 mg total) by mouth 2 (two) times daily with a meal. 05/30/24   Harris, Abigail, PA-C  nicotine  polacrilex (NICORETTE ) 2 MG gum Take 1 each (2 mg total) by mouth as needed for smoking cessation. 04/08/23   Massengill, Rankin, MD  OLANZapine  (ZYPREXA ) 2.5 MG tablet Take 1 tablet (2.5 mg total) by mouth daily. 08/08/23 09/07/23  White, Patrice L, NP  OLANZapine  (ZYPREXA ) 5 MG tablet Take 1 tablet (5 mg total) by mouth at bedtime. 08/08/23 09/07/23  White, Wyline CROME, NP  ondansetron  (ZOFRAN -ODT) 4 MG disintegrating tablet Take 1 tablet (4 mg total) by mouth every 8 (eight) hours as needed for nausea or vomiting. 05/30/24   Harris, Abigail, PA-C  oxyCODONE  (ROXICODONE ) 5 MG immediate release tablet Take 0.5-1 tablets (2.5-5 mg total) by mouth every 6 (six) hours as needed for severe pain (pain score 7-10). 05/30/24   Harris, Abigail, PA-C  Prucalopride Succinate  (MOTEGRITY ) 2 MG TABS Take 1 tablet (2 mg total) by mouth daily. 05/30/24   Harris, Abigail, PA-C  traZODone  (DESYREL ) 100 MG tablet Take 1 tablet (100 mg total) by mouth at bedtime. 08/08/23 09/07/23  Teresa Wyline CROME, NP    Allergies: Haldol  [haloperidol ], Hydrocodone -acetaminophen , Vicodin [hydrocodone -acetaminophen ], Aspirin, and Penicillin v    Review of Systems  Constitutional:  Negative for fever.  Respiratory:  Positive for shortness of breath.   Cardiovascular:  Negative for chest pain.  Gastrointestinal:  Positive for abdominal pain and nausea. Negative for diarrhea  and vomiting.    Updated Vital Signs BP 105/77 (BP Location: Right Arm)   Pulse 77   Temp 97.9 F (36.6 C) (Oral)   Resp 16   Ht 5' 2 (1.575 m)   Wt 55.3 kg   SpO2 100%   BMI 22.31 kg/m   Physical Exam Vitals and nursing note reviewed.  Constitutional:      General: She is not in acute distress.    Appearance: She is not ill-appearing.  HENT:     Head: Normocephalic.  Eyes:     Pupils: Pupils are equal,  round, and reactive to light.  Cardiovascular:     Rate and Rhythm: Normal rate and regular rhythm.     Pulses: Normal pulses.     Heart sounds: Normal heart sounds. No murmur heard.    No friction rub. No gallop.  Pulmonary:     Effort: Pulmonary effort is normal.     Breath sounds: Normal breath sounds.     Comments: Respirations equal and unlabored, patient able to speak in full sentences, lungs clear to auscultation bilaterally Abdominal:     General: Abdomen is flat. There is no distension.     Palpations: Abdomen is soft.     Tenderness: There is no abdominal tenderness. There is no guarding or rebound.     Comments: Mild diffuse tenderness without rebound or guarding.   Musculoskeletal:        General: Normal range of motion.     Cervical back: Neck supple.  Skin:    General: Skin is warm and dry.  Neurological:     General: No focal deficit present.     Mental Status: She is alert.  Psychiatric:        Mood and Affect: Mood normal.        Behavior: Behavior normal.     (all labs ordered are listed, but only abnormal results are displayed) Labs Reviewed  LIPASE, BLOOD - Abnormal; Notable for the following components:      Result Value   Lipase <10 (*)    All other components within normal limits  COMPREHENSIVE METABOLIC PANEL WITH GFR - Abnormal; Notable for the following components:   Glucose, Bld 119 (*)    BUN 5 (*)    Total Protein 8.2 (*)    All other components within normal limits  URINALYSIS, ROUTINE W REFLEX MICROSCOPIC - Abnormal; Notable for the following components:   Specific Gravity, Urine <1.005 (*)    Ketones, ur 80 (*)    All other components within normal limits  CBC    EKG: None  Radiology: CT ABDOMEN PELVIS W CONTRAST Result Date: 11/24/2024 CLINICAL DATA:  Abdominal pain, acute, nonlocalized. Nausea. Headache. EXAM: CT ABDOMEN AND PELVIS WITH CONTRAST TECHNIQUE: Multidetector CT imaging of the abdomen and pelvis was performed using the  standard protocol following bolus administration of intravenous contrast. RADIATION DOSE REDUCTION: This exam was performed according to the departmental dose-optimization program which includes automated exposure control, adjustment of the mA and/or kV according to patient size and/or use of iterative reconstruction technique. CONTRAST:  OMNIPAQUE  IOHEXOL  300 MG/ML  SOLN COMPARISON:  05/30/2024, 08/04/2021 and 09/07/2020 FINDINGS: Lower chest: Lung bases are clear.  No pleural effusions. Hepatobiliary: No acute abnormality to the liver or gallbladder. No biliary dilatation. Pancreas: Unremarkable. No pancreatic ductal dilatation or surrounding inflammatory changes. Spleen: Normal in size without focal abnormality. Adrenals/Urinary Tract: Normal adrenal glands. Probable 2 mm stone in left kidney lower pole. No hydronephrosis.  No suspicious renal lesions. Normal urinary bladder. Stomach/Bowel: Normal stomach. No bowel dilatation or obstruction. No focal bowel inflammation. Vascular/Lymphatic: Vascular structures are unremarkable. No significant lymph node enlargement in the abdomen or pelvis. However, there is low-density material within the mesentery, best seen on image 36, sequence 2 and image 39, sequence 2. This low-density material has more of an infiltrative configuration rather than a well-defined structure. This low-density material measures 1.1 cm in thickness on image 39 and this low-dense material has water  attenuation. In retrospect, this finding has been present since 2021 and no significant progression since a post contrast study in 2022. This finding most likely represents a benign mesenteric finding and could be related to abnormal lymphatics or congenital malformation. Reproductive: Uterus appears to be surgically absent. No evidence for an adnexal lesion or mass. Other: Negative for free air. Musculoskeletal: No acute bone abnormality. IMPRESSION: 1. No acute abnormality in the abdomen or  pelvis. 2. Nonobstructive left renal calculus. 3. Small amount of low-density material within the mesentery. This finding has been present since 2021. This finding is indeterminate but most likely represents a benign etiology and could be a congenital lymphatic malformation such as a mesenteric lymphangioma. Electronically Signed   By: Juliene Balder M.D.   On: 11/24/2024 13:04   DG Chest Portable 1 View Result Date: 11/24/2024 CLINICAL DATA:  Nausea with constant headache. EXAM: PORTABLE CHEST 1 VIEW COMPARISON:  February 11, 2023 FINDINGS: The heart size and mediastinal contours are within normal limits. Both lungs are clear. The visualized skeletal structures are unremarkable. IMPRESSION: No active disease. Electronically Signed   By: Suzen Dials M.D.   On: 11/24/2024 11:49     Procedures   Medications Ordered in the ED  sodium chloride  0.9 % bolus 1,000 mL (0 mLs Intravenous Stopped 11/24/24 1419)  ondansetron  (ZOFRAN ) injection 4 mg (4 mg Intravenous Given 11/24/24 1130)  iohexol  (OMNIPAQUE ) 300 MG/ML solution 100 mL (100 mLs Intravenous Contrast Given 11/24/24 1153)                                    Medical Decision Making Amount and/or Complexity of Data Reviewed External Data Reviewed: notes. Labs: ordered. Decision-making details documented in ED Course. Radiology: ordered and independent interpretation performed. Decision-making details documented in ED Course. ECG/medicine tests: ordered and independent interpretation performed. Decision-making details documented in ED Course.  Risk Prescription drug management.   This patient presents to the ED for concern of Nausea, this involves an extensive number of treatment options, and is a complaint that carries with it a high risk of complications and morbidity.  The differential diagnosis includes pancreatitis, bowel obstruction,   38 year old female presents to the ED due to persistent nausea and decreased p.o. intake.  Has a  CT abdomen scheduled next week.  Notes whenever she eats anything she gets acutely nauseous.  Has been taking Zofran  with no relief.  Also endorses intermittent shortness of breath and headaches. No chest pain. Recently found mold in her house which she attributes to her symptoms.  Upon arrival patient borderline tachycardic at 104 with normal O2 saturation at 100% on room air.  Patient well-appearing on exam.  Abdomen soft, nondistended with very mild diffuse tenderness.  No rebound or guarding.  Lungs clear to auscultation bilaterally.  Routine labs ordered.  Will go ahead and obtain CT abdomen.  Chest x-ray. IVFs and zofran  given.   CBC with no  leukocytosis.  Normal hemoglobin.  CMP with hyperglycemia 119.  Normal anion gap.  Normal creatinine.  No major electrolyte derangements.  No elevation in lipase to suggest pancreatitis.  Chest x-ray personally reviewed and interpreted which negative for signs of pneumonia, pneumothorax, widened mediastinum.  CT abdomen negative for any acute abnormalities.  Does demonstrate a left renal calculus.  Also demonstrates small amount of low-density material within the mesentery which has been present since 2021. UA negative for signs of infection.   Upon reassessment, patient admits to improvement in nausea. No episodes of emesis in the ED. discharged with nausea medication.  Advised patient to follow-up with her GI doctor for further evaluation and to discuss CT abdomen results.  No evidence of infection.  Patient able to tolerate p.o.  Patient stable for discharge.  Co morbidities that complicate the patient evaluation  Bipolar disorder  Cardiac Monitoring: / EKG:  The patient was maintained on a cardiac monitor.  I personally viewed and interpreted the cardiac monitored which showed an underlying rhythm of: NSR  Social Determinants of Health:  Tobacco abuse  Test / Admission - Considered:  Considered admission however, patient able to tolerate p.o.  CT  abdomen unremarkable for any acute findings.  Low suspicion for any emergent etiologies of nausea and abdominal pain.     Final diagnoses:  Nausea    ED Discharge Orders          Ordered    ondansetron  (ZOFRAN -ODT) 4 MG disintegrating tablet  Every 8 hours PRN        11/24/24 1446               Huntley Knoop C, PA-C 11/24/24 1515    Jerrol Agent, MD 11/25/24 [phone]
